# Patient Record
Sex: Male | Born: 1945 | Race: White | Hispanic: No
Health system: Southern US, Community
[De-identification: ages and names within clinical notes are randomized; demographics above are authoritative.]

## PROBLEM LIST (undated history)

## (undated) DIAGNOSIS — I5023 Acute on chronic systolic (congestive) heart failure: Secondary | ICD-10-CM

## (undated) DIAGNOSIS — J690 Pneumonitis due to inhalation of food and vomit: Secondary | ICD-10-CM

## (undated) DIAGNOSIS — J398 Other specified diseases of upper respiratory tract: Secondary | ICD-10-CM

## (undated) DIAGNOSIS — I482 Chronic atrial fibrillation, unspecified: Secondary | ICD-10-CM

## (undated) DIAGNOSIS — J9621 Acute and chronic respiratory failure with hypoxia: Secondary | ICD-10-CM

---

## 2019-04-12 ENCOUNTER — Inpatient Hospital Stay
Admission: EM | Admit: 2019-04-12 | Discharge: 2019-05-08 | Disposition: A | Discharge status: Skilled Nursing Facility | Payer: Medicare Other | Source: Other Acute Inpatient Hospital | Attending: Internal Medicine | Admitting: Internal Medicine

## 2019-04-12 ENCOUNTER — Other Ambulatory Visit (HOSPITAL_COMMUNITY): Payer: Medicare Other

## 2019-04-12 ENCOUNTER — Encounter: Payer: Self-pay | Admitting: Internal Medicine

## 2019-04-12 DIAGNOSIS — J398 Other specified diseases of upper respiratory tract: Secondary | ICD-10-CM | POA: Diagnosis present

## 2019-04-12 DIAGNOSIS — J9621 Acute and chronic respiratory failure with hypoxia: Secondary | ICD-10-CM | POA: Diagnosis present

## 2019-04-12 DIAGNOSIS — J690 Pneumonitis due to inhalation of food and vomit: Secondary | ICD-10-CM | POA: Diagnosis not present

## 2019-04-12 DIAGNOSIS — Z931 Gastrostomy status: Secondary | ICD-10-CM

## 2019-04-12 DIAGNOSIS — T17408A Unspecified foreign body in trachea causing other injury, initial encounter: Secondary | ICD-10-CM

## 2019-04-12 DIAGNOSIS — K5649 Other impaction of intestine: Secondary | ICD-10-CM

## 2019-04-12 DIAGNOSIS — J9690 Respiratory failure, unspecified, unspecified whether with hypoxia or hypercapnia: Secondary | ICD-10-CM

## 2019-04-12 DIAGNOSIS — I5023 Acute on chronic systolic (congestive) heart failure: Secondary | ICD-10-CM | POA: Diagnosis not present

## 2019-04-12 DIAGNOSIS — I482 Chronic atrial fibrillation, unspecified: Secondary | ICD-10-CM | POA: Diagnosis present

## 2019-04-12 DIAGNOSIS — R0602 Shortness of breath: Secondary | ICD-10-CM

## 2019-04-12 HISTORY — DX: Acute and chronic respiratory failure with hypoxia: J96.21

## 2019-04-12 HISTORY — DX: Chronic atrial fibrillation, unspecified: I48.20

## 2019-04-12 HISTORY — DX: Pneumonitis due to inhalation of food and vomit: J69.0

## 2019-04-12 HISTORY — DX: Other specified diseases of upper respiratory tract: J39.8

## 2019-04-12 HISTORY — DX: Acute on chronic systolic (congestive) heart failure: I50.23

## 2019-04-12 MED ORDER — ACETAMINOPHEN 325 MG PO TABS
650.00 | ORAL_TABLET | ORAL | Status: DC
Start: 2019-04-12 — End: 2019-04-12

## 2019-04-12 MED ORDER — FINASTERIDE 5 MG PO TABS
5.00 | ORAL_TABLET | ORAL | Status: DC
Start: 2019-04-13 — End: 2019-04-12

## 2019-04-12 MED ORDER — SENNOSIDES-DOCUSATE SODIUM 8.6-50 MG PO TABS
1.00 | ORAL_TABLET | ORAL | Status: DC
Start: ? — End: 2019-04-12

## 2019-04-12 MED ORDER — GENERIC EXTERNAL MEDICATION
12.50 | Status: DC
Start: 2019-04-13 — End: 2019-04-12

## 2019-04-12 MED ORDER — ALPRAZOLAM 0.5 MG PO TABS
0.50 | ORAL_TABLET | ORAL | Status: DC
Start: ? — End: 2019-04-12

## 2019-04-12 MED ORDER — APIXABAN 5 MG PO TABS
5.00 | ORAL_TABLET | ORAL | Status: DC
Start: 2019-04-12 — End: 2019-04-12

## 2019-04-12 MED ORDER — HYDROMORPHONE HCL 1 MG/ML PO LIQD
2.00 | ORAL | Status: DC
Start: ? — End: 2019-04-12

## 2019-04-12 MED ORDER — SERTRALINE HCL 50 MG PO TABS
50.00 | ORAL_TABLET | ORAL | Status: DC
Start: 2019-04-13 — End: 2019-04-12

## 2019-04-12 MED ORDER — LIDOCAINE 4 % EX PTCH
1.00 | MEDICATED_PATCH | CUTANEOUS | Status: DC
Start: 2019-04-12 — End: 2019-04-12

## 2019-04-12 MED ORDER — DIGOXIN 125 MCG PO TABS
0.13 | ORAL_TABLET | ORAL | Status: DC
Start: 2019-04-13 — End: 2019-04-12

## 2019-04-12 MED ORDER — GLYCOPYRROLATE 1 MG PO TABS
1.00 | ORAL_TABLET | ORAL | Status: DC
Start: ? — End: 2019-04-12

## 2019-04-12 MED ORDER — DIPHENOXYLATE-ATROPINE 2.5-0.025 MG/5ML PO LIQD
5.00 | ORAL | Status: DC
Start: ? — End: 2019-04-12

## 2019-04-12 MED ORDER — IOHEXOL 300 MG/ML  SOLN: 50.0000 mL | Freq: Once | INTRAMUSCULAR | Status: DC | PRN

## 2019-04-12 MED ORDER — ARFORMOTEROL TARTRATE 15 MCG/2ML IN NEBU
15.00 | INHALATION_SOLUTION | RESPIRATORY_TRACT | Status: DC
Start: 2019-04-12 — End: 2019-04-12

## 2019-04-12 MED ORDER — TERAZOSIN HCL 1 MG PO CAPS
2.00 | ORAL_CAPSULE | ORAL | Status: DC
Start: 2019-04-12 — End: 2019-04-12

## 2019-04-12 MED ORDER — DEXTROSE 10 % IV SOLN
125.00 | INTRAVENOUS | Status: DC
Start: ? — End: 2019-04-12

## 2019-04-12 MED ORDER — ROSUVASTATIN CALCIUM 20 MG PO TABS
20.00 | ORAL_TABLET | ORAL | Status: DC
Start: 2019-04-13 — End: 2019-04-12

## 2019-04-12 MED ORDER — QUINTABS PO TABS
1.00 | ORAL_TABLET | ORAL | Status: DC
Start: 2019-04-13 — End: 2019-04-12

## 2019-04-12 MED ORDER — LIDOCAINE 4 % EX PTCH
1.00 | MEDICATED_PATCH | CUTANEOUS | Status: DC
Start: 2019-04-13 — End: 2019-04-12

## 2019-04-12 MED ORDER — ASPIRIN 81 MG PO CHEW
81.00 | CHEWABLE_TABLET | ORAL | Status: DC
Start: 2019-04-13 — End: 2019-04-12

## 2019-04-12 MED ORDER — GLUCOSE 40 % PO GEL
15.00 | ORAL | Status: DC
Start: ? — End: 2019-04-12

## 2019-04-12 MED ORDER — GENERIC EXTERNAL MEDICATION
40.00 | Status: DC
Start: 2019-04-13 — End: 2019-04-12

## 2019-04-12 MED ORDER — PHENOL 1.4 % MT LIQD
1.00 | OROMUCOSAL | Status: DC
Start: ? — End: 2019-04-12

## 2019-04-12 MED ORDER — LISINOPRIL 5 MG PO TABS
5.00 | ORAL_TABLET | ORAL | Status: DC
Start: 2019-04-13 — End: 2019-04-12

## 2019-04-12 MED ORDER — BUDESONIDE 0.5 MG/2ML IN SUSP
0.50 | RESPIRATORY_TRACT | Status: DC
Start: 2019-04-12 — End: 2019-04-12

## 2019-04-12 MED ORDER — TORSEMIDE 10 MG PO TABS
20.00 | ORAL_TABLET | ORAL | Status: DC
Start: 2019-04-13 — End: 2019-04-12

## 2019-04-12 MED ORDER — MIRTAZAPINE 15 MG PO TABS
15.00 | ORAL_TABLET | ORAL | Status: DC
Start: 2019-04-12 — End: 2019-04-12

## 2019-04-12 MED ORDER — ALBUTEROL SULFATE (2.5 MG/3ML) 0.083% IN NEBU
2.50 | INHALATION_SOLUTION | RESPIRATORY_TRACT | Status: DC
Start: ? — End: 2019-04-12

## 2019-04-12 MED ORDER — POLYETHYLENE GLYCOL 3350 17 G PO PACK
17.00 | PACK | ORAL | Status: DC
Start: ? — End: 2019-04-12

## 2019-04-12 MED ORDER — GENERIC EXTERNAL MEDICATION
Status: DC
Start: ? — End: 2019-04-12

## 2019-04-12 MED ORDER — CETIRIZINE HCL 10 MG PO TABS
10.00 | ORAL_TABLET | ORAL | Status: DC
Start: 2019-04-13 — End: 2019-04-12

## 2019-04-12 MED ORDER — GABAPENTIN 300 MG PO CAPS
300.00 | ORAL_CAPSULE | ORAL | Status: DC
Start: 2019-04-12 — End: 2019-04-12

## 2019-04-12 NOTE — Consult Note (Signed)
Pulmonary [place]  Date of Service: 04/12/2019  PULMONARY CRITICAL CARE CONSULT   [name] [facility]  XNA:[id number]  DOB: 1946/03/19   DOA: 04/12/2019  Referring Physician: Merton Border, MD  HPI: Phillip Whitaker is a 73 y.o. male seen for follow up of Acute on Chronic Respiratory Failure.  Patient has multiple medical problems including congestive heart failure cardiomyopathy dysphagia coronary artery disease chronic kidney disease stage III heart block systolic function of 54% severe mitral regurgitation presented to the hospital with increasing shortness of breath.  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  This hospital admission was mainly because of the tracheostomy and exacerbation of the congestive heart failure.  Patient apparently underwent a laryngoscopy by ENT was noted to have vocal cord hypomobility and there was also some posterior glottic scarring for which reason the patient underwent a tracheostomy.  Is probably going to need to remain in place.  The tracheostomy will need to be followed up by ENT.  Patient also as noted above has a history of an ejection fraction 25% had acute congestive heart failure now is doing better apparently he will need ongoing management.  Patient also did develop aspiration and suffered sepsis for which he was treated.  Time that he is seen he is on 35% oxygen on T collar  Review of Systems:  ROS performed and is unremarkable other than noted above.  Past Medical History: Past Medical History:  Diagnosis Date  . Acute on chronic systolic congestive heart failure ([place])  . Arthritis  . Atrial fibrillation ([place])  . Chronic kidney disease  . 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 Coronary artery disease  . Epistaxis  . Gout  . Hyperlipidemia  . Hypertension  . Hypertension with goal to be determined   . Myocardial infarction ([place])  . Sinusitis  . Vertigo   Social History: Social History   Socioeconomic History  . Marital status: Divorced  Spouse name: Not on file  . Number of children: Not on file  . Years of education: Not on file  . Highest education level: Not on file  Occupational History  . Not on file  Social Needs  . Financial resource strain: Not on file  . Food insecurity  Worry: Not on file  Inability: Not on file  . Transportation needs  Medical: Not on file  Non-medical: Not on file  Tobacco Use  . Smoking status: Never Smoker  . Smokeless tobacco: Never Used   Family History: Family History  Problem Relation Age of Onset  . Diabetes Mother  . Stroke Mother  . Hypermobility Mother  . Hyperlipidemia Mother  . Diabetes Father  . Heart disease Father  . Kidney disease Father  . Hypertension Father  . No Known Problems Sister  . No Known Problems Brother    Medications: Reviewed on Rounds  Physical Exam:  Vitals: Temperature 97.8 pulse 89 respiratory 18 blood pressure 102/69 saturations 98%  Ventilator Settings off the ventilator right now on T collar with an FiO2 of 35%  . General: Comfortable at this time . Eyes: Grossly normal lids, irises & conjunctiva . ENT: grossly tongue is normal . Neck: no obvious mass . Cardiovascular: S1-S2 normal no gallop or rub . Respiratory: No rhonchi no rales are noted at this time . Abdomen: Soft and nontender . Skin: no rash seen on limited exam . Musculoskeletal: not  rigid . Psychiatric:unable to assess . Neurologic: no seizure no involuntary movements         Labs on Admission:  Basic Metabolic Panel: No results for input(s): NA, K, CL, CO2, GLUCOSE, BUN, CREATININE, CALCIUM, MG, PHOS in the last 168 hours.  No results for input(s): PHART, PCO2ART, PO2ART, HCO3, O2SAT in the last 168 hours.  Liver Function Tests: No results for input(s): AST, ALT, ALKPHOS, BILITOT, PROT, ALBUMIN in the last 168  hours. No results for input(s): LIPASE, AMYLASE in the last 168 hours. No results for input(s): AMMONIA in the last 168 hours.  CBC: No results for input(s): WBC, NEUTROABS, HGB, HCT, MCV, PLT in the last 168 hours.  Cardiac Enzymes: No results for input(s): CKTOTAL, CKMB, CKMBINDEX, TROPONINI in the last 168 hours.  BNP (last 3 results) No results for input(s): BNP in the last 8760 hours.  ProBNP (last 3 results) No results for input(s): PROBNP in the last 8760 hours.   Radiological Exams on Admission: No results found.  Assessment/Plan Active Problems:   Acute on chronic respiratory failure with hypoxia (HCC)   Acute on chronic systolic heart failure (HCC)   Chronic atrial fibrillation (HCC)   Tracheal stenosis   Aspiration pneumonia due to gastric secretions (HCC)   1. Acute on chronic respiratory failure hypoxia patient required tracheostomy for tracheal stenosis.  The tracheostomy will remain in place.  Will titrate oxygen as necessary right now is on 35% as mentioned above need to wean the FiO2 down 2. Acute on chronic systolic heart failure the patient's last ejection fraction was 25% need to monitor the fluid status closely diuretics as necessary and continue with medical management. 3. Tracheal stenosis patient had tracheostomy placed will need to follow-up with ENT 4. Aspiration pneumonia has been treated follow radiologically we will continue with supportive care 5. Chronic atrial fibrillation patient is rate now rate controlled we will continue with supportive care anticoagulation is warranted.  I have personally seen and evaluated the patient, evaluated laboratory and imaging results, formulated the assessment and plan and placed orders. The Patient requires high complexity decision making for assessment and support.  Case was discussed on Rounds with the Respiratory Therapy Staff Time Spent  Yevonne Pax, MD [facility] Pulmonary Critical Care  Medicine Sleep Medicine

## 2019-04-13 DIAGNOSIS — J690 Pneumonitis due to inhalation of food and vomit: Secondary | ICD-10-CM | POA: Diagnosis not present

## 2019-04-13 DIAGNOSIS — I482 Chronic atrial fibrillation, unspecified: Secondary | ICD-10-CM | POA: Diagnosis not present

## 2019-04-13 DIAGNOSIS — I5023 Acute on chronic systolic (congestive) heart failure: Secondary | ICD-10-CM | POA: Diagnosis not present

## 2019-04-13 DIAGNOSIS — J9621 Acute and chronic respiratory failure with hypoxia: Secondary | ICD-10-CM | POA: Diagnosis not present

## 2019-04-13 LAB — CBC
HCT: 28.8 % — ABNORMAL LOW (ref 39.0–52.0)
Hemoglobin: 9.4 g/dL — ABNORMAL LOW (ref 13.0–17.0)
MCH: 30.2 pg (ref 26.0–34.0)
MCHC: 32.6 g/dL (ref 30.0–36.0)
MCV: 92.6 fL (ref 80.0–100.0)
Platelets: 205 10*3/uL (ref 150–400)
RBC: 3.11 MIL/uL — ABNORMAL LOW (ref 4.22–5.81)
RDW: 16.7 % — ABNORMAL HIGH (ref 11.5–15.5)
WBC: 12.3 10*3/uL — ABNORMAL HIGH (ref 4.0–10.5)
nRBC: 0 % (ref 0.0–0.2)

## 2019-04-13 LAB — URINALYSIS, ROUTINE W REFLEX MICROSCOPIC
Bilirubin Urine: NEGATIVE
Glucose, UA: NEGATIVE mg/dL
Hgb urine dipstick: NEGATIVE
Ketones, ur: NEGATIVE mg/dL
Leukocytes,Ua: NEGATIVE
Nitrite: NEGATIVE
Protein, ur: NEGATIVE mg/dL
Specific Gravity, Urine: 1.008 (ref 1.005–1.030)
pH: 7 (ref 5.0–8.0)

## 2019-04-13 LAB — COMPREHENSIVE METABOLIC PANEL
ALT: 60 U/L — ABNORMAL HIGH (ref 0–44)
AST: 65 U/L — ABNORMAL HIGH (ref 15–41)
Albumin: 1.8 g/dL — ABNORMAL LOW (ref 3.5–5.0)
Alkaline Phosphatase: 74 U/L (ref 38–126)
Anion gap: 10 (ref 5–15)
BUN: 23 mg/dL (ref 8–23)
CO2: 29 mmol/L (ref 22–32)
Calcium: 8 mg/dL — ABNORMAL LOW (ref 8.9–10.3)
Chloride: 99 mmol/L (ref 98–111)
Creatinine, Ser: 1.22 mg/dL (ref 0.61–1.24)
GFR calc Af Amer: >60 mL/min (ref >60)
GFR calc non Af Amer: 58 mL/min — ABNORMAL LOW (ref >60)
Glucose, Bld: 126 mg/dL — ABNORMAL HIGH (ref 70–99)
Potassium: 3.4 mmol/L — ABNORMAL LOW (ref 3.5–5.1)
Sodium: 138 mmol/L (ref 135–145)
Total Bilirubin: 1 mg/dL (ref 0.3–1.2)
Total Protein: 4.7 g/dL — ABNORMAL LOW (ref 6.5–8.1)

## 2019-04-13 LAB — TSH: TSH: 2.701 u[IU]/mL (ref 0.350–4.500)

## 2019-04-13 NOTE — Progress Notes (Signed)
Pulmonary Critical Care Medicine [place]   PULMONARY CRITICAL CARE SERVICE  PROGRESS NOTE  Date of Service: 04/13/2019  Kycen Spalla  KZS:[id number]  DOB: 04/21/1946   DOA: 04/12/2019  Referring Physician: Merton Border, MD  HPI: Zarif Rathje is a 73 y.o. male seen for follow up of Acute on Chronic Respiratory Failure.  Patient right now is on T collar has been on 30% FiO2 with good saturations at this time  Medications: Reviewed on Rounds  Physical Exam:  Vitals: Temperature 98.3 pulse 85 respiratory rate 15 blood pressure 119/65 saturations 100%  Ventilator Settings off the ventilator right now on T collar with an FiO2 of 30%  . General: Comfortable at this time . Eyes: Grossly normal lids, irises & conjunctiva . ENT: grossly tongue is normal . Neck: no obvious mass . Cardiovascular: S1 S2 normal no gallop . Respiratory: No rhonchi coarse breath sounds are noted . Abdomen: soft . Skin: no rash seen on limited exam . Musculoskeletal: not rigid . Psychiatric:unable to assess . Neurologic: no seizure no involuntary movements         Lab Data:   Basic Metabolic Panel: No results for input(s): NA, K, CL, CO2, GLUCOSE, BUN, CREATININE, CALCIUM, MG, PHOS in the last 168 hours.  ABG: No results for input(s): PHART, PCO2ART, PO2ART, HCO3, O2SAT in the last 168 hours.  Liver Function Tests: No results for input(s): AST, ALT, ALKPHOS, BILITOT, PROT, ALBUMIN in the last 168 hours. No results for input(s): LIPASE, AMYLASE in the last 168 hours. No results for input(s): AMMONIA in the last 168 hours.  CBC: No results for input(s): WBC, NEUTROABS, HGB, HCT, MCV, PLT in the last 168 hours.  Cardiac Enzymes: No results for input(s): CKTOTAL, CKMB, CKMBINDEX, TROPONINI in the last 168 hours.  BNP (last 3 results) No results for input(s): BNP in the last 8760 hours.  ProBNP (last 3 results) No results for input(s): PROBNP in the last  8760 hours.  Radiological Exams: Dg Abdomen Peg Tube Location  Result Date: 04/12/2019 CLINICAL DATA:  Peg tube placement EXAM: ABDOMEN - 1 VIEW COMPARISON:  None. FINDINGS: Injection through the patient's gastrostomy tube opacifies the stomach. The bowel gas pattern is nonspecific and nonobstructive. The partially visualized heart appears significantly enlarged. IMPRESSION: Injection of contrast through the gastrostomy tube opacifies the stomach. Electronically Signed   By: Constance Holster M.D.   On: 04/12/2019 18:31   Dg Chest Port 1 View  Result Date: 04/12/2019 CLINICAL DATA:  Tracheostomy tube EXAM: PORTABLE CHEST 1 VIEW COMPARISON:  None. FINDINGS: There is a tracheostomy tube terminates above the carina. There is a left-sided ICD. The heart size is significantly enlarged. The patient is status post prior median sternotomy. There is no pneumothorax. There are moderate-sized bilateral pleural effusions. Bibasilar airspace opacities are noted favored to represent compressive atelectasis with an infiltrate not excluded. There is no acute osseous abnormality. Multiple old bilateral rib fractures are noted. IMPRESSION: 1. Lines and tubes as above. 2. Significant cardiomegaly. 3. Moderate-sized bilateral pleural effusions. 4. Bibasilar airspace opacities favored to represent compressive atelectasis with an infiltrate not excluded. Electronically Signed   By: Constance Holster M.D.   On: 04/12/2019 18:32    Assessment/Plan Active Problems:   Acute on chronic respiratory failure with hypoxia (HCC)   Acute on chronic systolic heart failure (HCC)   Chronic atrial fibrillation (HCC)   Tracheal stenosis   Aspiration pneumonia due to gastric secretions ([place])   1. Acute on chronic  respiratory failure hypoxia patient remains on T collar 30% FiO2 continue to advance as tolerated.  Secretions are fair to moderate.  Patient is not a candidate for decannulation 2. Acute on chronic systolic heart  failure follow radiologically the last chest x-ray shows moderate-sized effusions and some airspace disease along with significant cardiomegaly patient's ejection fraction was 25% 3. Chronic atrial fibrillation rate is controlled 4. Aspiration pneumonia has been treated we will continue to follow 5. Tracheal stenosis will need follow-up after discharge with ENT   I have personally seen and evaluated the patient, evaluated laboratory and imaging results, formulated the assessment and plan and placed orders. The Patient requires high complexity decision making for assessment and support.  Case was discussed on Rounds with the Respiratory Therapy Staff  Yevonne Pax, MD [facility] Pulmonary Critical Care Medicine Sleep Medicine

## 2019-04-14 DIAGNOSIS — J690 Pneumonitis due to inhalation of food and vomit: Secondary | ICD-10-CM | POA: Diagnosis not present

## 2019-04-14 DIAGNOSIS — I482 Chronic atrial fibrillation, unspecified: Secondary | ICD-10-CM | POA: Diagnosis not present

## 2019-04-14 DIAGNOSIS — J9621 Acute and chronic respiratory failure with hypoxia: Secondary | ICD-10-CM | POA: Diagnosis not present

## 2019-04-14 DIAGNOSIS — I5023 Acute on chronic systolic (congestive) heart failure: Secondary | ICD-10-CM | POA: Diagnosis not present

## 2019-04-14 LAB — BASIC METABOLIC PANEL
Anion gap: 10 (ref 5–15)
BUN: 24 mg/dL — ABNORMAL HIGH (ref 8–23)
CO2: 32 mmol/L (ref 22–32)
Calcium: 8.4 mg/dL — ABNORMAL LOW (ref 8.9–10.3)
Chloride: 96 mmol/L — ABNORMAL LOW (ref 98–111)
Creatinine, Ser: 1.33 mg/dL — ABNORMAL HIGH (ref 0.61–1.24)
GFR calc Af Amer: >60 mL/min (ref >60)
GFR calc non Af Amer: 52 mL/min — ABNORMAL LOW (ref >60)
Glucose, Bld: 113 mg/dL — ABNORMAL HIGH (ref 70–99)
Potassium: 4.3 mmol/L (ref 3.5–5.1)
Sodium: 138 mmol/L (ref 135–145)

## 2019-04-14 LAB — CBC
HCT: 30.9 % — ABNORMAL LOW (ref 39.0–52.0)
Hemoglobin: 9.8 g/dL — ABNORMAL LOW (ref 13.0–17.0)
MCH: 30.4 pg (ref 26.0–34.0)
MCHC: 31.7 g/dL (ref 30.0–36.0)
MCV: 96 fL (ref 80.0–100.0)
Platelets: 232 10*3/uL (ref 150–400)
RBC: 3.22 MIL/uL — ABNORMAL LOW (ref 4.22–5.81)
RDW: 16.5 % — ABNORMAL HIGH (ref 11.5–15.5)
WBC: 9.7 10*3/uL (ref 4.0–10.5)
nRBC: 0 % (ref 0.0–0.2)

## 2019-04-14 NOTE — Progress Notes (Signed)
Pulmonary Critical Care Medicine [facility]   PULMONARY CRITICAL CARE SERVICE  PROGRESS NOTE  Date of Service: 04/14/2019  Phillip Whitaker  [id number]  DOB: 10/22/1945   DOA: 04/12/2019  Referring Physician: Carron Curie, MD  HPI: Phillip Whitaker is a 73 y.o. male seen for follow up of Acute on Chronic Respiratory Failure.  Patient is on T collar is on 30% FiO2 using PMV  Medications: Reviewed on Rounds  Physical Exam:  Vitals: Temperature 96.7 pulse 90 respiratory 21 blood pressure is 134/75 saturations 100%  Ventilator Settings on T collar with an FiO2 30% and on PMV  . General: Comfortable at this time . Eyes: Grossly normal lids, irises & conjunctiva . ENT: grossly tongue is normal . Neck: no obvious mass . Cardiovascular: S1 S2 normal no gallop . Respiratory: No rhonchi no rales are noted . Abdomen: soft . Skin: no rash seen on limited exam . Musculoskeletal: not rigid . Psychiatric:unable to assess . Neurologic: no seizure no involuntary movements         Lab Data:   Basic Metabolic Panel: Recent Labs  Lab 04/13/19 1430 04/14/19 0533  NA 138 138  K 3.4* 4.3  CL 99 96*  CO2 29 32  GLUCOSE 126* 113*  BUN 23 24*  CREATININE 1.22 1.33*  CALCIUM 8.0* 8.4*    ABG: No results for input(s): PHART, PCO2ART, PO2ART, HCO3, O2SAT in the last 168 hours.  Liver Function Tests: Recent Labs  Lab 04/13/19 1430  AST 65*  ALT 60*  ALKPHOS 74  BILITOT 1.0  PROT 4.7*  ALBUMIN 1.8*   No results for input(s): LIPASE, AMYLASE in the last 168 hours. No results for input(s): AMMONIA in the last 168 hours.  CBC: Recent Labs  Lab 04/13/19 1430 04/14/19 0533  WBC 12.3* 9.7  HGB 9.4* 9.8*  HCT 28.8* 30.9*  MCV 92.6 96.0  PLT 205 232    Cardiac Enzymes: No results for input(s): CKTOTAL, CKMB, CKMBINDEX, TROPONINI in the last 168 hours.  BNP (last 3 results) No results for input(s): BNP in the last 8760 hours.  ProBNP  (last 3 results) No results for input(s): PROBNP in the last 8760 hours.  Radiological Exams: Dg Abdomen Peg Tube Location  Result Date: 04/12/2019 CLINICAL DATA:  Peg tube placement EXAM: ABDOMEN - 1 VIEW COMPARISON:  None. FINDINGS: Injection through the patient's gastrostomy tube opacifies the stomach. The bowel gas pattern is nonspecific and nonobstructive. The partially visualized heart appears significantly enlarged. IMPRESSION: Injection of contrast through the gastrostomy tube opacifies the stomach. Electronically Signed   By: Katherine Mantle M.D.   On: 04/12/2019 18:31   Dg Chest Port 1 View  Result Date: 04/12/2019 CLINICAL DATA:  Tracheostomy tube EXAM: PORTABLE CHEST 1 VIEW COMPARISON:  None. FINDINGS: There is a tracheostomy tube terminates above the carina. There is a left-sided ICD. The heart size is significantly enlarged. The patient is status post prior median sternotomy. There is no pneumothorax. There are moderate-sized bilateral pleural effusions. Bibasilar airspace opacities are noted favored to represent compressive atelectasis with an infiltrate not excluded. There is no acute osseous abnormality. Multiple old bilateral rib fractures are noted. IMPRESSION: 1. Lines and tubes as above. 2. Significant cardiomegaly. 3. Moderate-sized bilateral pleural effusions. 4. Bibasilar airspace opacities favored to represent compressive atelectasis with an infiltrate not excluded. Electronically Signed   By: Katherine Mantle M.D.   On: 04/12/2019 18:32    Assessment/Plan Active Problems:   Acute on chronic respiratory  failure with hypoxia (HCC)   Acute on chronic systolic heart failure (HCC)   Chronic atrial fibrillation (HCC)   Tracheal stenosis   Aspiration pneumonia due to gastric secretions ([place])   1. Acute on chronic respiratory failure hypoxia plan is to continue T collar on titrate oxygen as tolerated 2. Acute on chronic systolic heart failure continue with monitoring  fluid status 3. Chronic atrial fibrillation rate controlled 4. Tracheal stenosis stable 5. Aspiration pneumonia treated   I have personally seen and evaluated the patient, evaluated laboratory and imaging results, formulated the assessment and plan and placed orders. The Patient requires high complexity decision making for assessment and support.  Case was discussed on Rounds with the Respiratory Therapy Staff  Allyne Gee, MD [facility] Pulmonary Critical Care Medicine Sleep Medicine

## 2019-04-15 DIAGNOSIS — J9621 Acute and chronic respiratory failure with hypoxia: Secondary | ICD-10-CM | POA: Diagnosis not present

## 2019-04-15 DIAGNOSIS — J690 Pneumonitis due to inhalation of food and vomit: Secondary | ICD-10-CM | POA: Diagnosis not present

## 2019-04-15 DIAGNOSIS — I5023 Acute on chronic systolic (congestive) heart failure: Secondary | ICD-10-CM | POA: Diagnosis not present

## 2019-04-15 DIAGNOSIS — I482 Chronic atrial fibrillation, unspecified: Secondary | ICD-10-CM | POA: Diagnosis not present

## 2019-04-15 NOTE — Progress Notes (Signed)
Pulmonary Critical Care Medicine [place]   PULMONARY CRITICAL CARE SERVICE  PROGRESS NOTE  Date of Service: 04/15/2019  Phillip Whitaker  ALP:[id number]  DOB: 04-14-1946   DOA: 04/12/2019  Referring Physician: Merton Border, MD  HPI: Phillip Whitaker is a 73 y.o. male seen for follow up of Acute on Chronic Respiratory Failure.  Patient currently is on T collar has been on 20% FiO2 with good saturations  Medications: Reviewed on Rounds  Physical Exam:  Vitals: Temperature 97.5 pulse 80 respiratory rate 16 blood pressure 126/63 saturations 100%  Ventilator Settings patient is on T collar currently on 28% FiO2 with the PMV  . General: Comfortable at this time . Eyes: Grossly normal lids, irises & conjunctiva . ENT: grossly tongue is normal . Neck: no obvious mass . Cardiovascular: S1 S2 normal no gallop . Respiratory: No rhonchi no rales are noted at this time . Abdomen: soft . Skin: no rash seen on limited exam . Musculoskeletal: not rigid . Psychiatric:unable to assess . Neurologic: no seizure no involuntary movements         Lab Data:   Basic Metabolic Panel: Recent Labs  Lab 04/13/19 1430 04/14/19 0533  NA 138 138  K 3.4* 4.3  CL 99 96*  CO2 29 32  GLUCOSE 126* 113*  BUN 23 24*  CREATININE 1.22 1.33*  CALCIUM 8.0* 8.4*    ABG: No results for input(s): PHART, PCO2ART, PO2ART, HCO3, O2SAT in the last 168 hours.  Liver Function Tests: Recent Labs  Lab 04/13/19 1430  AST 65*  ALT 60*  ALKPHOS 74  BILITOT 1.0  PROT 4.7*  ALBUMIN 1.8*   No results for input(s): LIPASE, AMYLASE in the last 168 hours. No results for input(s): AMMONIA in the last 168 hours.  CBC: Recent Labs  Lab 04/13/19 1430 04/14/19 0533  WBC 12.3* 9.7  HGB 9.4* 9.8*  HCT 28.8* 30.9*  MCV 92.6 96.0  PLT 205 232    Cardiac Enzymes: No results for input(s): CKTOTAL, CKMB, CKMBINDEX, TROPONINI in the last 168 hours.  BNP (last 3 results) No  results for input(s): BNP in the last 8760 hours.  ProBNP (last 3 results) No results for input(s): PROBNP in the last 8760 hours.  Radiological Exams: No results found.  Assessment/Plan Active Problems:   Acute on chronic respiratory failure with hypoxia (HCC)   Acute on chronic systolic heart failure (HCC)   Chronic atrial fibrillation (HCC)   Tracheal stenosis   Aspiration pneumonia due to gastric secretions ([place])   1. Acute on chronic respiratory failure with hypoxia patient is comfortable right now without any distress use the PMV as tolerated 2. Acute on chronic systolic heart failure compensated at this time 3. Chronic atrial fibrillation rate controlled 4. Tracheal stenosis no changes are noted 5. Aspiration pneumonia treated we will continue with supportive care   I have personally seen and evaluated the patient, evaluated laboratory and imaging results, formulated the assessment and plan and placed orders. The Patient requires high complexity decision making for assessment and support.  Case was discussed on Rounds with the Respiratory Therapy Staff  Allyne Gee, MD [facility] Pulmonary Critical Care Medicine Sleep Medicine

## 2019-04-16 DIAGNOSIS — I5023 Acute on chronic systolic (congestive) heart failure: Secondary | ICD-10-CM | POA: Diagnosis not present

## 2019-04-16 DIAGNOSIS — J690 Pneumonitis due to inhalation of food and vomit: Secondary | ICD-10-CM | POA: Diagnosis not present

## 2019-04-16 DIAGNOSIS — J9621 Acute and chronic respiratory failure with hypoxia: Secondary | ICD-10-CM | POA: Diagnosis not present

## 2019-04-16 DIAGNOSIS — I482 Chronic atrial fibrillation, unspecified: Secondary | ICD-10-CM | POA: Diagnosis not present

## 2019-04-16 NOTE — Progress Notes (Signed)
Pulmonary Critical Care Medicine [place]   PULMONARY CRITICAL CARE SERVICE  PROGRESS NOTE  Date of Service: 04/16/2019  Phillip Whitaker  [id number]  DOB: [birth year]   DOA: 04/12/2019  Referring Physician: Merton Border, MD  HPI: Phillip Whitaker is a 73 y.o. male seen for follow up of Acute on Chronic Respiratory Failure.  Patient is right now on T collar has been on 28% FiO2 with good saturations at this time  Medications: Reviewed on Rounds  Physical Exam:  Vitals: Temperature is 97.4 pulse 81 respiratory rate 20 blood pressure 106/62 saturations 98%  Ventilator Settings off the ventilator on T collar right now  . General: Comfortable at this time . Eyes: Grossly normal lids, irises & conjunctiva . ENT: grossly tongue is normal . Neck: no obvious mass . Cardiovascular: S1 S2 normal no gallop . Respiratory: No rhonchi no rales are noted at this time . Abdomen: soft . Skin: no rash seen on limited exam . Musculoskeletal: not rigid . Psychiatric:unable to assess . Neurologic: no seizure no involuntary movements         Lab Data:   Basic Metabolic Panel: Recent Labs  Lab 04/13/19 1430 04/14/19 0533  NA 138 138  K 3.4* 4.3  CL 99 96*  CO2 29 32  GLUCOSE 126* 113*  BUN 23 24*  CREATININE 1.22 1.33*  CALCIUM 8.0* 8.4*    ABG: No results for input(s): PHART, PCO2ART, PO2ART, HCO3, O2SAT in the last 168 hours.  Liver Function Tests: Recent Labs  Lab 04/13/19 1430  AST 65*  ALT 60*  ALKPHOS 74  BILITOT 1.0  PROT 4.7*  ALBUMIN 1.8*   No results for input(s): LIPASE, AMYLASE in the last 168 hours. No results for input(s): AMMONIA in the last 168 hours.  CBC: Recent Labs  Lab 04/13/19 1430 04/14/19 0533  WBC 12.3* 9.7  HGB 9.4* 9.8*  HCT 28.8* 30.9*  MCV 92.6 96.0  PLT 205 232    Cardiac Enzymes: No results for input(s): CKTOTAL, CKMB, CKMBINDEX, TROPONINI in the last 168 hours.  BNP (last 3 results) No  results for input(s): BNP in the last 8760 hours.  ProBNP (last 3 results) No results for input(s): PROBNP in the last 8760 hours.  Radiological Exams: No results found.  Assessment/Plan Active Problems:   Acute on chronic respiratory failure with hypoxia (HCC)   Acute on chronic systolic heart failure (HCC)   Chronic atrial fibrillation (HCC)   Tracheal stenosis   Aspiration pneumonia due to gastric secretions ([place])   1. Acute on chronic respiratory failure hypoxia continue with T collar as tolerated patient is doing well on 28% FiO2 2. Acute systolic heart failure compensated continue present management 3. Chronic atrial fibrillation rate is controlled 4. Tracheal stenosis follow-up after discharge 5. Aspiration pneumonia treated improved   I have personally seen and evaluated the patient, evaluated laboratory and imaging results, formulated the assessment and plan and placed orders. The Patient requires high complexity decision making for assessment and support.  Case was discussed on Rounds with the Respiratory Therapy Staff  Allyne Gee, MD [facility] Pulmonary Critical Care Medicine Sleep Medicine

## 2019-04-17 DIAGNOSIS — J9621 Acute and chronic respiratory failure with hypoxia: Secondary | ICD-10-CM | POA: Diagnosis not present

## 2019-04-17 DIAGNOSIS — J690 Pneumonitis due to inhalation of food and vomit: Secondary | ICD-10-CM | POA: Diagnosis not present

## 2019-04-17 DIAGNOSIS — I5023 Acute on chronic systolic (congestive) heart failure: Secondary | ICD-10-CM | POA: Diagnosis not present

## 2019-04-17 DIAGNOSIS — I482 Chronic atrial fibrillation, unspecified: Secondary | ICD-10-CM | POA: Diagnosis not present

## 2019-04-17 NOTE — Progress Notes (Signed)
Pulmonary Critical Care Medicine [place]   PULMONARY CRITICAL CARE SERVICE  PROGRESS NOTE  Date of Service: 04/17/2019  Phillip Whitaker  [id number]  DOB: 06-29-1945   DOA: 04/12/2019  Referring Physician: Merton Border, MD  HPI: Phillip Whitaker is a 73 y.o. male seen for follow up of Acute on Chronic Respiratory Failure.  Currently is on T collar patient's been tolerating PMV is on 28% FiO2 good saturations  Medications: Reviewed on Rounds  Physical Exam:  Vitals: Temperature 98.6 pulse 80 respiratory rate 20 blood pressure 94/57 saturations 98%  Ventilator Settings on T collar with an FiO2 28% using the PMV also  . General: Comfortable at this time . Eyes: Grossly normal lids, irises & conjunctiva . ENT: grossly tongue is normal . Neck: no obvious mass . Cardiovascular: S1 S2 normal no gallop . Respiratory: No rhonchi no rales are noted at this time . Abdomen: soft . Skin: no rash seen on limited exam . Musculoskeletal: not rigid . Psychiatric:unable to assess . Neurologic: no seizure no involuntary movements         Lab Data:   Basic Metabolic Panel: Recent Labs  Lab 04/13/19 1430 04/14/19 0533  NA 138 138  K 3.4* 4.3  CL 99 96*  CO2 29 32  GLUCOSE 126* 113*  BUN 23 24*  CREATININE 1.22 1.33*  CALCIUM 8.0* 8.4*    ABG: No results for input(s): PHART, PCO2ART, PO2ART, HCO3, O2SAT in the last 168 hours.  Liver Function Tests: Recent Labs  Lab 04/13/19 1430  AST 65*  ALT 60*  ALKPHOS 74  BILITOT 1.0  PROT 4.7*  ALBUMIN 1.8*   No results for input(s): LIPASE, AMYLASE in the last 168 hours. No results for input(s): AMMONIA in the last 168 hours.  CBC: Recent Labs  Lab 04/13/19 1430 04/14/19 0533  WBC 12.3* 9.7  HGB 9.4* 9.8*  HCT 28.8* 30.9*  MCV 92.6 96.0  PLT 205 232    Cardiac Enzymes: No results for input(s): CKTOTAL, CKMB, CKMBINDEX, TROPONINI in the last 168 hours.  BNP (last 3 results) No  results for input(s): BNP in the last 8760 hours.  ProBNP (last 3 results) No results for input(s): PROBNP in the last 8760 hours.  Radiological Exams: No results found.  Assessment/Plan Active Problems:   Acute on chronic respiratory failure with hypoxia (HCC)   Acute on chronic systolic heart failure (HCC)   Chronic atrial fibrillation (HCC)   Tracheal stenosis   Aspiration pneumonia due to gastric secretions ([place])   1. Acute on chronic respiratory failure hypoxia plan is to continue with T collar trials titrate oxygen continue pulmonary toilet 2. Acute on chronic systolic heart failure compensated we will continue with present management 3. Chronic atrial fibrillation rate is controlled 4. Tracheal stenosis follow-up after discharge 5. Aspiration pneumonia treated we will continue with supportive care   I have personally seen and evaluated the patient, evaluated laboratory and imaging results, formulated the assessment and plan and placed orders. The Patient requires high complexity decision making for assessment and support.  Case was discussed on Rounds with the Respiratory Therapy Staff  Allyne Gee, MD [facility] Pulmonary Critical Care Medicine Sleep Medicine

## 2019-04-18 DIAGNOSIS — J9621 Acute and chronic respiratory failure with hypoxia: Secondary | ICD-10-CM | POA: Diagnosis not present

## 2019-04-18 DIAGNOSIS — I5023 Acute on chronic systolic (congestive) heart failure: Secondary | ICD-10-CM | POA: Diagnosis not present

## 2019-04-18 DIAGNOSIS — I482 Chronic atrial fibrillation, unspecified: Secondary | ICD-10-CM | POA: Diagnosis not present

## 2019-04-18 DIAGNOSIS — J690 Pneumonitis due to inhalation of food and vomit: Secondary | ICD-10-CM | POA: Diagnosis not present

## 2019-04-18 NOTE — Progress Notes (Signed)
Pulmonary Critical Care Medicine [place]   PULMONARY CRITICAL CARE SERVICE  PROGRESS NOTE  Date of Service: 04/18/2019  Phillip Whitaker  JWJ:[id number]  DOB: 07-Jun-1945   DOA: 04/12/2019  Referring Physician: Merton Border, MD  HPI: Phillip Whitaker is a 73 y.o. male seen for follow up of Acute on Chronic Respiratory Failure.  Patient is on T collar has been on 28% FiO2 at baseline.  Right now is comfortable without distress  Medications: Reviewed on Rounds  Physical Exam:  Vitals: Temperature 97.2 pulse 87 respiratory rate 21 blood pressure 92/55 saturations 98%  Ventilator Settings currently is off the ventilator on T collar  . General: Comfortable at this time . Eyes: Grossly normal lids, irises & conjunctiva . ENT: grossly tongue is normal . Neck: no obvious mass . Cardiovascular: S1 S2 normal no gallop . Respiratory: No rhonchi no rales are noted at this time . Abdomen: soft . Skin: no rash seen on limited exam . Musculoskeletal: not rigid . Psychiatric:unable to assess . Neurologic: no seizure no involuntary movements         Lab Data:   Basic Metabolic Panel: Recent Labs  Lab 04/13/19 1430 04/14/19 0533  NA 138 138  K 3.4* 4.3  CL 99 96*  CO2 29 32  GLUCOSE 126* 113*  BUN 23 24*  CREATININE 1.22 1.33*  CALCIUM 8.0* 8.4*    ABG: No results for input(s): PHART, PCO2ART, PO2ART, HCO3, O2SAT in the last 168 hours.  Liver Function Tests: Recent Labs  Lab 04/13/19 1430  AST 65*  ALT 60*  ALKPHOS 74  BILITOT 1.0  PROT 4.7*  ALBUMIN 1.8*   No results for input(s): LIPASE, AMYLASE in the last 168 hours. No results for input(s): AMMONIA in the last 168 hours.  CBC: Recent Labs  Lab 04/13/19 1430 04/14/19 0533  WBC 12.3* 9.7  HGB 9.4* 9.8*  HCT 28.8* 30.9*  MCV 92.6 96.0  PLT 205 232    Cardiac Enzymes: No results for input(s): CKTOTAL, CKMB, CKMBINDEX, TROPONINI in the last 168 hours.  BNP (last 3  results) No results for input(s): BNP in the last 8760 hours.  ProBNP (last 3 results) No results for input(s): PROBNP in the last 8760 hours.  Radiological Exams: No results found.  Assessment/Plan Active Problems:   Acute on chronic respiratory failure with hypoxia (HCC)   Acute on chronic systolic heart failure (HCC)   Chronic atrial fibrillation (HCC)   Tracheal stenosis   Aspiration pneumonia due to gastric secretions ([place])   1. Acute on chronic respiratory failure with hypoxia patient is on T collar and on 28% FiO2 2. Acute on chronic systolic heart failure compensated 3. Chronic atrial fibrillation rate is controlled 4. Aspiration pneumonia treated 5. Tracheal stenosis at baseline   I have personally seen and evaluated the patient, evaluated laboratory and imaging results, formulated the assessment and plan and placed orders. The Patient requires high complexity decision making for assessment and support.  Case was discussed on Rounds with the Respiratory Therapy Staff  Allyne Gee, MD [facility] Pulmonary Critical Care Medicine Sleep Medicine

## 2019-04-19 DIAGNOSIS — J9621 Acute and chronic respiratory failure with hypoxia: Secondary | ICD-10-CM | POA: Diagnosis not present

## 2019-04-19 DIAGNOSIS — J690 Pneumonitis due to inhalation of food and vomit: Secondary | ICD-10-CM | POA: Diagnosis not present

## 2019-04-19 DIAGNOSIS — I5023 Acute on chronic systolic (congestive) heart failure: Secondary | ICD-10-CM | POA: Diagnosis not present

## 2019-04-19 DIAGNOSIS — I482 Chronic atrial fibrillation, unspecified: Secondary | ICD-10-CM | POA: Diagnosis not present

## 2019-04-19 LAB — BASIC METABOLIC PANEL WITH GFR
Anion gap: 9 (ref 5–15)
BUN: 27 mg/dL — ABNORMAL HIGH (ref 8–23)
CO2: 29 mmol/L (ref 22–32)
Calcium: 8.3 mg/dL — ABNORMAL LOW (ref 8.9–10.3)
Chloride: 97 mmol/L — ABNORMAL LOW (ref 98–111)
Creatinine, Ser: 1.12 mg/dL (ref 0.61–1.24)
GFR calc Af Amer: >60 mL/min
GFR calc non Af Amer: >60 mL/min
Glucose, Bld: 95 mg/dL (ref 70–99)
Potassium: 4.8 mmol/L (ref 3.5–5.1)
Sodium: 135 mmol/L (ref 135–145)

## 2019-04-19 LAB — CBC
HCT: 29.6 % — ABNORMAL LOW (ref 39.0–52.0)
Hemoglobin: 9.3 g/dL — ABNORMAL LOW (ref 13.0–17.0)
MCH: 29.9 pg (ref 26.0–34.0)
MCHC: 31.4 g/dL (ref 30.0–36.0)
MCV: 95.2 fL (ref 80.0–100.0)
Platelets: 197 10*3/uL (ref 150–400)
RBC: 3.11 MIL/uL — ABNORMAL LOW (ref 4.22–5.81)
RDW: 16.7 % — ABNORMAL HIGH (ref 11.5–15.5)
WBC: 5.3 10*3/uL (ref 4.0–10.5)
nRBC: 0 % (ref 0.0–0.2)

## 2019-04-19 NOTE — Progress Notes (Signed)
Pulmonary Critical Care Medicine [place]   PULMONARY CRITICAL CARE SERVICE  PROGRESS NOTE  Date of Service: 04/19/2019  Al Bracewell  WIO:[id number]  DOB: Jul 23, 1945   DOA: 04/12/2019  Referring Physician: Merton Border, MD  HPI: Phillip Whitaker is a 73 y.o. male seen for follow up of Acute on Chronic Respiratory Failure.  Patient is on T collar has been on 28% FiO2 with good saturations  Medications: Reviewed on Rounds  Physical Exam:  Vitals: Temperature 96.9 pulse 80 respiratory rate 12 blood pressure 110/72 saturations 96%  Ventilator Settings off the ventilator on T collar currently on 20% FiO2 with the PMV  . General: Comfortable at this time . Eyes: Grossly normal lids, irises & conjunctiva . ENT: grossly tongue is normal . Neck: no obvious mass . Cardiovascular: S1 S2 normal no gallop . Respiratory: No rhonchi no rales are noted at this time . Abdomen: soft . Skin: no rash seen on limited exam . Musculoskeletal: not rigid . Psychiatric:unable to assess . Neurologic: no seizure no involuntary movements         Lab Data:   Basic Metabolic Panel: Recent Labs  Lab 04/13/19 1430 04/14/19 0533 04/19/19 0538  NA 138 138 135  K 3.4* 4.3 4.8  CL 99 96* 97*  CO2 29 32 29  GLUCOSE 126* 113* 95  BUN 23 24* 27*  CREATININE 1.22 1.33* 1.12  CALCIUM 8.0* 8.4* 8.3*    ABG: No results for input(s): PHART, PCO2ART, PO2ART, HCO3, O2SAT in the last 168 hours.  Liver Function Tests: Recent Labs  Lab 04/13/19 1430  AST 65*  ALT 60*  ALKPHOS 74  BILITOT 1.0  PROT 4.7*  ALBUMIN 1.8*   No results for input(s): LIPASE, AMYLASE in the last 168 hours. No results for input(s): AMMONIA in the last 168 hours.  CBC: Recent Labs  Lab 04/13/19 1430 04/14/19 0533 04/19/19 0538  WBC 12.3* 9.7 5.3  HGB 9.4* 9.8* 9.3*  HCT 28.8* 30.9* 29.6*  MCV 92.6 96.0 95.2  PLT 205 232 197    Cardiac Enzymes: No results for input(s):  CKTOTAL, CKMB, CKMBINDEX, TROPONINI in the last 168 hours.  BNP (last 3 results) No results for input(s): BNP in the last 8760 hours.  ProBNP (last 3 results) No results for input(s): PROBNP in the last 8760 hours.  Radiological Exams: No results found.  Assessment/Plan Active Problems:   Acute on chronic respiratory failure with hypoxia (HCC)   Acute on chronic systolic heart failure (HCC)   Chronic atrial fibrillation (HCC)   Tracheal stenosis   Aspiration pneumonia due to gastric secretions ([place])   1. Acute on chronic respiratory failure hypoxia plan is to continue with the T collar patient is at baseline using PMV tolerating it well 2. Acute on chronic systolic heart failure compensated 3. Chronic atrial fibrillation rate controlled 4. Aspiration pneumonia treated 5. Tracheal stenosis follow-up after discharge   I have personally seen and evaluated the patient, evaluated laboratory and imaging results, formulated the assessment and plan and placed orders. The Patient requires high complexity decision making for assessment and support.  Case was discussed on Rounds with the Respiratory Therapy Staff  Allyne Gee, MD [facility] Pulmonary Critical Care Medicine Sleep Medicine

## 2019-04-20 DIAGNOSIS — J9621 Acute and chronic respiratory failure with hypoxia: Secondary | ICD-10-CM | POA: Diagnosis not present

## 2019-04-20 DIAGNOSIS — I5023 Acute on chronic systolic (congestive) heart failure: Secondary | ICD-10-CM | POA: Diagnosis not present

## 2019-04-20 DIAGNOSIS — J690 Pneumonitis due to inhalation of food and vomit: Secondary | ICD-10-CM | POA: Diagnosis not present

## 2019-04-20 DIAGNOSIS — I482 Chronic atrial fibrillation, unspecified: Secondary | ICD-10-CM | POA: Diagnosis not present

## 2019-04-20 NOTE — Progress Notes (Signed)
Pulmonary Critical Care Medicine [place]   PULMONARY CRITICAL CARE SERVICE  PROGRESS NOTE  Date of Service: 04/20/2019  Phillip Whitaker  [id number]  DOB: 08-26-1945   DOA: 04/12/2019  Referring Physician: Merton Border, MD  HPI: Phillip Whitaker is a 73 y.o. male seen for follow up of Acute on Chronic Respiratory Failure.  Patient is on T collar has been on 28% FiO2 with good saturations  Medications: Reviewed on Rounds  Physical Exam:  Vitals: Temperature 98.2 pulse 86 respiratory 21 blood pressure 122/78 saturations 100%  Ventilator Settings off the ventilator on T collar FiO2 28%  . General: Comfortable at this time . Eyes: Grossly normal lids, irises & conjunctiva . ENT: grossly tongue is normal . Neck: no obvious mass . Cardiovascular: S1 S2 normal no gallop . Respiratory: No rhonchi no rales. . Abdomen: soft . Skin: no rash seen on limited exam . Musculoskeletal: not rigid . Psychiatric:unable to assess . Neurologic: no seizure no involuntary movements         Lab Data:   Basic Metabolic Panel: Recent Labs  Lab 04/13/19 1430 04/14/19 0533 04/19/19 0538  NA 138 138 135  K 3.4* 4.3 4.8  CL 99 96* 97*  CO2 29 32 29  GLUCOSE 126* 113* 95  BUN 23 24* 27*  CREATININE 1.22 1.33* 1.12  CALCIUM 8.0* 8.4* 8.3*    ABG: No results for input(s): PHART, PCO2ART, PO2ART, HCO3, O2SAT in the last 168 hours.  Liver Function Tests: Recent Labs  Lab 04/13/19 1430  AST 65*  ALT 60*  ALKPHOS 74  BILITOT 1.0  PROT 4.7*  ALBUMIN 1.8*   No results for input(s): LIPASE, AMYLASE in the last 168 hours. No results for input(s): AMMONIA in the last 168 hours.  CBC: Recent Labs  Lab 04/13/19 1430 04/14/19 0533 04/19/19 0538  WBC 12.3* 9.7 5.3  HGB 9.4* 9.8* 9.3*  HCT 28.8* 30.9* 29.6*  MCV 92.6 96.0 95.2  PLT 205 232 197    Cardiac Enzymes: No results for input(s): CKTOTAL, CKMB, CKMBINDEX, TROPONINI in the last 168  hours.  BNP (last 3 results) No results for input(s): BNP in the last 8760 hours.  ProBNP (last 3 results) No results for input(s): PROBNP in the last 8760 hours.  Radiological Exams: No results found.  Assessment/Plan Active Problems:   Acute on chronic respiratory failure with hypoxia (HCC)   Acute on chronic systolic heart failure (HCC)   Chronic atrial fibrillation (HCC)   Tracheal stenosis   Aspiration pneumonia due to gastric secretions ([place])   1. Acute on chronic respiratory failure with hypoxia patient is on baseline settings of the T collar right now has only requiring 28% FiO2 we will continue with pulmonary toilet supportive care 2. Acute on chronic systolic heart failure compensated we will continue with present management 3. Chronic A. fib-rate controlled 4. Tracheal stenosis at baseline 5. Aspiration pneumonia treated we will continue to follow   I have personally seen and evaluated the patient, evaluated laboratory and imaging results, formulated the assessment and plan and placed orders. The Patient requires high complexity decision making for assessment and support.  Case was discussed on Rounds with the Respiratory Therapy Staff  Allyne Gee, MD [facility] Pulmonary Critical Care Medicine Sleep Medicine

## 2019-04-21 DIAGNOSIS — J690 Pneumonitis due to inhalation of food and vomit: Secondary | ICD-10-CM | POA: Diagnosis not present

## 2019-04-21 DIAGNOSIS — I5023 Acute on chronic systolic (congestive) heart failure: Secondary | ICD-10-CM | POA: Diagnosis not present

## 2019-04-21 DIAGNOSIS — I482 Chronic atrial fibrillation, unspecified: Secondary | ICD-10-CM | POA: Diagnosis not present

## 2019-04-21 DIAGNOSIS — J9621 Acute and chronic respiratory failure with hypoxia: Secondary | ICD-10-CM | POA: Diagnosis not present

## 2019-04-21 NOTE — Progress Notes (Signed)
Pulmonary Critical Care Medicine [place]   PULMONARY CRITICAL CARE SERVICE  PROGRESS NOTE  Date of Service: 04/21/2019  Phillip Whitaker  [id number]  DOB: [birth year]   DOA: 04/12/2019  Referring Physician: Merton Border, MD  HPI: Phillip Whitaker is a 73 y.o. male seen for follow up of Acute on Chronic Respiratory Failure.  Patient is on 28% FiO2 currently on T collar good saturations are noted  Medications: Reviewed on Rounds  Physical Exam:  Vitals: Temperature 98.2 pulse 94 respiratory rate 31 blood pressure 100/71 saturations 100%  Ventilator Settings off the ventilator currently on T collar with an FiO2 of 28%  . General: Comfortable at this time . Eyes: Grossly normal lids, irises & conjunctiva . ENT: grossly tongue is normal . Neck: no obvious mass . Cardiovascular: S1 S2 normal no gallop . Respiratory: No rhonchi coarse breath sounds are noted at this time . Abdomen: soft . Skin: no rash seen on limited exam . Musculoskeletal: not rigid . Psychiatric:unable to assess . Neurologic: no seizure no involuntary movements         Lab Data:   Basic Metabolic Panel: Recent Labs  Lab 04/19/19 0538  NA 135  K 4.8  CL 97*  CO2 29  GLUCOSE 95  BUN 27*  CREATININE 1.12  CALCIUM 8.3*    ABG: No results for input(s): PHART, PCO2ART, PO2ART, HCO3, O2SAT in the last 168 hours.  Liver Function Tests: No results for input(s): AST, ALT, ALKPHOS, BILITOT, PROT, ALBUMIN in the last 168 hours. No results for input(s): LIPASE, AMYLASE in the last 168 hours. No results for input(s): AMMONIA in the last 168 hours.  CBC: Recent Labs  Lab 04/19/19 0538  WBC 5.3  HGB 9.3*  HCT 29.6*  MCV 95.2  PLT 197    Cardiac Enzymes: No results for input(s): CKTOTAL, CKMB, CKMBINDEX, TROPONINI in the last 168 hours.  BNP (last 3 results) No results for input(s): BNP in the last 8760 hours.  ProBNP (last 3 results) No results for input(s):  PROBNP in the last 8760 hours.  Radiological Exams: No results found.  Assessment/Plan Active Problems:   Acute on chronic respiratory failure with hypoxia (HCC)   Acute on chronic systolic heart failure (HCC)   Chronic atrial fibrillation (HCC)   Tracheal stenosis   Aspiration pneumonia due to gastric secretions ([place])   1. Acute on chronic respiratory failure hypoxia patient continues on T collar continue secretion management pulmonary toilet. 2. Acute on chronic systolic heart failure compensated 3. Chronic atrial fibrillation rate is controlled 4. Tracheal stenosis at baseline we will continue to follow 5. Aspiration pneumonia treated we will continue with supportive care   I have personally seen and evaluated the patient, evaluated laboratory and imaging results, formulated the assessment and plan and placed orders. The Patient requires high complexity decision making for assessment and support.  Case was discussed on Rounds with the Respiratory Therapy Staff  Allyne Gee, MD [facility] Pulmonary Critical Care Medicine Sleep Medicine

## 2019-04-22 DIAGNOSIS — J9621 Acute and chronic respiratory failure with hypoxia: Secondary | ICD-10-CM | POA: Diagnosis not present

## 2019-04-22 DIAGNOSIS — J690 Pneumonitis due to inhalation of food and vomit: Secondary | ICD-10-CM | POA: Diagnosis not present

## 2019-04-22 DIAGNOSIS — I482 Chronic atrial fibrillation, unspecified: Secondary | ICD-10-CM | POA: Diagnosis not present

## 2019-04-22 DIAGNOSIS — I5023 Acute on chronic systolic (congestive) heart failure: Secondary | ICD-10-CM | POA: Diagnosis not present

## 2019-04-22 NOTE — Progress Notes (Signed)
Pulmonary Critical Care Medicine [place]   PULMONARY CRITICAL CARE SERVICE  PROGRESS NOTE  Date of Service: 04/22/2019  Phillip Whitaker  [id number]  DOB: May 29, 1945   DOA: 04/12/2019  Referring Physician: Merton Border, MD  HPI: Phillip Whitaker is a 73 y.o. male seen for follow up of Acute on Chronic Respiratory Failure.  Patient currently is on T collar has been on 20% FiO2 good saturations are noted at this time  Medications: Reviewed on Rounds  Physical Exam:  Vitals: Temperature 97.4 pulse 84 respiratory rate 16 blood pressure 90/53 saturations 100%  Ventilator Settings currently on T collar with an FiO2 of 28%  . General: Comfortable at this time . Eyes: Grossly normal lids, irises & conjunctiva . ENT: grossly tongue is normal . Neck: no obvious mass . Cardiovascular: S1 S2 normal no gallop . Respiratory: No rhonchi coarse breath sounds are noted . Abdomen: soft . Skin: no rash seen on limited exam . Musculoskeletal: not rigid . Psychiatric:unable to assess . Neurologic: no seizure no involuntary movements         Lab Data:   Basic Metabolic Panel: Recent Labs  Lab 04/19/19 0538  NA 135  K 4.8  CL 97*  CO2 29  GLUCOSE 95  BUN 27*  CREATININE 1.12  CALCIUM 8.3*    ABG: No results for input(s): PHART, PCO2ART, PO2ART, HCO3, O2SAT in the last 168 hours.  Liver Function Tests: No results for input(s): AST, ALT, ALKPHOS, BILITOT, PROT, ALBUMIN in the last 168 hours. No results for input(s): LIPASE, AMYLASE in the last 168 hours. No results for input(s): AMMONIA in the last 168 hours.  CBC: Recent Labs  Lab 04/19/19 0538  WBC 5.3  HGB 9.3*  HCT 29.6*  MCV 95.2  PLT 197    Cardiac Enzymes: No results for input(s): CKTOTAL, CKMB, CKMBINDEX, TROPONINI in the last 168 hours.  BNP (last 3 results) No results for input(s): BNP in the last 8760 hours.  ProBNP (last 3 results) No results for input(s): PROBNP in  the last 8760 hours.  Radiological Exams: No results found.  Assessment/Plan Active Problems:   Acute on chronic respiratory failure with hypoxia (HCC)   Acute on chronic systolic heart failure (HCC)   Chronic atrial fibrillation (HCC)   Tracheal stenosis   Aspiration pneumonia due to gastric secretions ([place])   1. Acute on chronic respiratory failure hypoxia plan continue with T collar trials titrate oxygen continue pulmonary toilet 2. Acute on chronic systolic heart failure baseline 3. Chronic atrial fibrillation rate controlled 4. Aspiration pneumonia treated slowly improving we will continue to follow 5. Tracheal stenosis patient is at baseline will need follow-up after discharge   I have personally seen and evaluated the patient, evaluated laboratory and imaging results, formulated the assessment and plan and placed orders. The Patient requires high complexity decision making for assessment and support.  Case was discussed on Rounds with the Respiratory Therapy Staff  Allyne Gee, MD [facility] Pulmonary Critical Care Medicine Sleep Medicine

## 2019-04-23 DIAGNOSIS — I5023 Acute on chronic systolic (congestive) heart failure: Secondary | ICD-10-CM | POA: Diagnosis not present

## 2019-04-23 DIAGNOSIS — J690 Pneumonitis due to inhalation of food and vomit: Secondary | ICD-10-CM | POA: Diagnosis not present

## 2019-04-23 DIAGNOSIS — I482 Chronic atrial fibrillation, unspecified: Secondary | ICD-10-CM | POA: Diagnosis not present

## 2019-04-23 DIAGNOSIS — J9621 Acute and chronic respiratory failure with hypoxia: Secondary | ICD-10-CM | POA: Diagnosis not present

## 2019-04-23 LAB — BASIC METABOLIC PANEL
Anion gap: 9 (ref 5–15)
BUN: 33 mg/dL — ABNORMAL HIGH (ref 8–23)
CO2: 32 mmol/L (ref 22–32)
Calcium: 9.2 mg/dL (ref 8.9–10.3)
Chloride: 98 mmol/L (ref 98–111)
Creatinine, Ser: 1.36 mg/dL — ABNORMAL HIGH (ref 0.61–1.24)
GFR calc Af Amer: 59 mL/min — ABNORMAL LOW (ref >60)
GFR calc non Af Amer: 51 mL/min — ABNORMAL LOW (ref >60)
Glucose, Bld: 97 mg/dL (ref 70–99)
Potassium: 5.3 mmol/L — ABNORMAL HIGH (ref 3.5–5.1)
Sodium: 139 mmol/L (ref 135–145)

## 2019-04-23 LAB — CBC
HCT: 31.9 % — ABNORMAL LOW (ref 39.0–52.0)
Hemoglobin: 10.1 g/dL — ABNORMAL LOW (ref 13.0–17.0)
MCH: 30.9 pg (ref 26.0–34.0)
MCHC: 31.7 g/dL (ref 30.0–36.0)
MCV: 97.6 fL (ref 80.0–100.0)
Platelets: 203 10*3/uL (ref 150–400)
RBC: 3.27 MIL/uL — ABNORMAL LOW (ref 4.22–5.81)
RDW: 17 % — ABNORMAL HIGH (ref 11.5–15.5)
WBC: 4.4 10*3/uL (ref 4.0–10.5)
nRBC: 0 % (ref 0.0–0.2)

## 2019-04-23 LAB — MAGNESIUM: Magnesium: 2.5 mg/dL — ABNORMAL HIGH (ref 1.7–2.4)

## 2019-04-23 NOTE — Progress Notes (Signed)
Pulmonary Critical Care Medicine [place]   PULMONARY CRITICAL CARE SERVICE  PROGRESS NOTE  Date of Service: 04/23/2019  Loki Wuthrich  IEP:[id number]  DOB: 24-Sep-1945   DOA: 04/12/2019  Referring Physician: Merton Border, MD  HPI: Phillip Whitaker is a 73 y.o. male seen for follow up of Acute on Chronic Respiratory Failure.  Patient currently is on T collar has been on 28% FiO2 using PMV  Medications: Reviewed on Rounds  Physical Exam:  Vitals: Temperature 97.1 pulse 80 respiratory rate 16 blood pressure is 169/75 saturations 100%  Ventilator Settings off the ventilator on T collar with an FiO2 of 28% using PMV  . General: Comfortable at this time . Eyes: Grossly normal lids, irises & conjunctiva . ENT: grossly tongue is normal . Neck: no obvious mass . Cardiovascular: S1 S2 normal no gallop . Respiratory: No rhonchi no rales are noted at this time . Abdomen: soft . Skin: no rash seen on limited exam . Musculoskeletal: not rigid . Psychiatric:unable to assess . Neurologic: no seizure no involuntary movements         Lab Data:   Basic Metabolic Panel: Recent Labs  Lab 04/19/19 0538 04/23/19 0628  NA 135 139  K 4.8 5.3*  CL 97* 98  CO2 29 32  GLUCOSE 95 97  BUN 27* 33*  CREATININE 1.12 1.36*  CALCIUM 8.3* 9.2  MG  --  2.5*    ABG: No results for input(s): PHART, PCO2ART, PO2ART, HCO3, O2SAT in the last 168 hours.  Liver Function Tests: No results for input(s): AST, ALT, ALKPHOS, BILITOT, PROT, ALBUMIN in the last 168 hours. No results for input(s): LIPASE, AMYLASE in the last 168 hours. No results for input(s): AMMONIA in the last 168 hours.  CBC: Recent Labs  Lab 04/19/19 0538 04/23/19 0628  WBC 5.3 4.4  HGB 9.3* 10.1*  HCT 29.6* 31.9*  MCV 95.2 97.6  PLT 197 203    Cardiac Enzymes: No results for input(s): CKTOTAL, CKMB, CKMBINDEX, TROPONINI in the last 168 hours.  BNP (last 3 results) No results for  input(s): BNP in the last 8760 hours.  ProBNP (last 3 results) No results for input(s): PROBNP in the last 8760 hours.  Radiological Exams: No results found.  Assessment/Plan Active Problems:   Acute on chronic respiratory failure with hypoxia (HCC)   Acute on chronic systolic heart failure (HCC)   Chronic atrial fibrillation (HCC)   Tracheal stenosis   Aspiration pneumonia due to gastric secretions ([place])   1. Acute on chronic respiratory failure with hypoxia plan is to continue with T collar tolerating PMV which will be continued right now is on 20% FiO2 2. Acute on chronic systolic heart failure at baseline we will continue to follow 3. Chronic atrial fibrillation rate is controlled 4. Tracheal stenosis at baseline continue present management 5. Aspiration pneumonia clinically improving   I have personally seen and evaluated the patient, evaluated laboratory and imaging results, formulated the assessment and plan and placed orders. The Patient requires high complexity decision making for assessment and support.  Case was discussed on Rounds with the Respiratory Therapy Staff  Allyne Gee, MD [facility] Pulmonary Critical Care Medicine Sleep Medicine

## 2019-04-24 DIAGNOSIS — J9621 Acute and chronic respiratory failure with hypoxia: Secondary | ICD-10-CM | POA: Diagnosis not present

## 2019-04-24 DIAGNOSIS — J690 Pneumonitis due to inhalation of food and vomit: Secondary | ICD-10-CM | POA: Diagnosis not present

## 2019-04-24 DIAGNOSIS — I482 Chronic atrial fibrillation, unspecified: Secondary | ICD-10-CM | POA: Diagnosis not present

## 2019-04-24 DIAGNOSIS — I5023 Acute on chronic systolic (congestive) heart failure: Secondary | ICD-10-CM | POA: Diagnosis not present

## 2019-04-24 NOTE — Progress Notes (Signed)
Pulmonary Critical Care Medicine [place]   PULMONARY CRITICAL CARE SERVICE  PROGRESS NOTE  Date of Service: 04/24/2019  Phillip Whitaker  [id number]  DOB: 1945/10/16   DOA: 04/12/2019  Referring Physician: Merton Border, MD  HPI: Phillip Whitaker is a 73 y.o. male seen for follow up of Acute on Chronic Respiratory Failure.  Patient has been capping for more than 24 hours actually doing quite well.  However holding off on any decannulation for now until we actually can get ENT to evaluate the patient.  Medications: Reviewed on Rounds  Physical Exam:  Vitals: Temperature 97.0 pulse 80 respiratory rate is 18 blood pressure 116/71 saturations 97%  Ventilator Settings capping off the ventilator  . General: Comfortable at this time . Eyes: Grossly normal lids, irises & conjunctiva . ENT: grossly tongue is normal . Neck: no obvious mass . Cardiovascular: S1 S2 normal no gallop . Respiratory: No rhonchi no rales are noted at this time . Abdomen: soft . Skin: no rash seen on limited exam . Musculoskeletal: not rigid . Psychiatric:unable to assess . Neurologic: no seizure no involuntary movements         Lab Data:   Basic Metabolic Panel: Recent Labs  Lab 04/19/19 0538 04/23/19 0628  NA 135 139  K 4.8 5.3*  CL 97* 98  CO2 29 32  GLUCOSE 95 97  BUN 27* 33*  CREATININE 1.12 1.36*  CALCIUM 8.3* 9.2  MG  --  2.5*    ABG: No results for input(s): PHART, PCO2ART, PO2ART, HCO3, O2SAT in the last 168 hours.  Liver Function Tests: No results for input(s): AST, ALT, ALKPHOS, BILITOT, PROT, ALBUMIN in the last 168 hours. No results for input(s): LIPASE, AMYLASE in the last 168 hours. No results for input(s): AMMONIA in the last 168 hours.  CBC: Recent Labs  Lab 04/19/19 0538 04/23/19 0628  WBC 5.3 4.4  HGB 9.3* 10.1*  HCT 29.6* 31.9*  MCV 95.2 97.6  PLT 197 203    Cardiac Enzymes: No results for input(s): CKTOTAL, CKMB, CKMBINDEX,  TROPONINI in the last 168 hours.  BNP (last 3 results) No results for input(s): BNP in the last 8760 hours.  ProBNP (last 3 results) No results for input(s): PROBNP in the last 8760 hours.  Radiological Exams: No results found.  Assessment/Plan Active Problems:   Acute on chronic respiratory failure with hypoxia (HCC)   Acute on chronic systolic heart failure (HCC)   Chronic atrial fibrillation (HCC)   Tracheal stenosis   Aspiration pneumonia due to gastric secretions ([place])   1. Acute on chronic respiratory failure hypoxia plan is to continue with capping trials as ordered patient is doing well with 24-hour completed 2. Acute on chronic systolic heart failure compensated we will continue with supportive care 3. Chronic atrial fibrillation rate controlled 4. Aspiration pneumonia treated resolved 5. Tracheal stenosis will have ENT reassess prior to decannulation   I have personally seen and evaluated the patient, evaluated laboratory and imaging results, formulated the assessment and plan and placed orders. The Patient requires high complexity decision making for assessment and support.  Case was discussed on Rounds with the Respiratory Therapy Staff  Allyne Gee, MD [facility] Pulmonary Critical Care Medicine Sleep Medicine

## 2019-04-25 ENCOUNTER — Other Ambulatory Visit (HOSPITAL_COMMUNITY): Payer: Medicare Other

## 2019-04-25 DIAGNOSIS — I5023 Acute on chronic systolic (congestive) heart failure: Secondary | ICD-10-CM | POA: Diagnosis not present

## 2019-04-25 DIAGNOSIS — J690 Pneumonitis due to inhalation of food and vomit: Secondary | ICD-10-CM | POA: Diagnosis not present

## 2019-04-25 DIAGNOSIS — J9621 Acute and chronic respiratory failure with hypoxia: Secondary | ICD-10-CM | POA: Diagnosis not present

## 2019-04-25 DIAGNOSIS — I482 Chronic atrial fibrillation, unspecified: Secondary | ICD-10-CM | POA: Diagnosis not present

## 2019-04-25 LAB — URINALYSIS, ROUTINE W REFLEX MICROSCOPIC
Bilirubin Urine: NEGATIVE
Glucose, UA: NEGATIVE mg/dL
Ketones, ur: NEGATIVE mg/dL
Nitrite: NEGATIVE
Protein, ur: 100 mg/dL — AB
Specific Gravity, Urine: 1.013 (ref 1.005–1.030)
WBC, UA: >50 WBC/hpf — ABNORMAL HIGH (ref 0–5)
pH: 9 — ABNORMAL HIGH (ref 5.0–8.0)

## 2019-04-25 LAB — BASIC METABOLIC PANEL
Anion gap: 11 (ref 5–15)
BUN: 33 mg/dL — ABNORMAL HIGH (ref 8–23)
CO2: 29 mmol/L (ref 22–32)
Calcium: 9.1 mg/dL (ref 8.9–10.3)
Chloride: 98 mmol/L (ref 98–111)
Creatinine, Ser: 1.44 mg/dL — ABNORMAL HIGH (ref 0.61–1.24)
GFR calc Af Amer: 55 mL/min — ABNORMAL LOW (ref >60)
GFR calc non Af Amer: 47 mL/min — ABNORMAL LOW (ref >60)
Glucose, Bld: 160 mg/dL — ABNORMAL HIGH (ref 70–99)
Potassium: 4.5 mmol/L (ref 3.5–5.1)
Sodium: 138 mmol/L (ref 135–145)

## 2019-04-25 NOTE — Progress Notes (Signed)
Pulmonary Critical Care Medicine [place]   PULMONARY CRITICAL CARE SERVICE  PROGRESS NOTE  Date of Service: 04/25/2019  Phillip Whitaker  CZY:[id number]  DOB: 06-16-1945   DOA: 04/12/2019  Referring Physician: Merton Border, MD  HPI: Phillip Whitaker is a 73 y.o. male seen for follow up of Acute on Chronic Respiratory Failure.  Patient has been capping for 48 hours did actually quite well.  I also did review the notes from [facility] regarding his tracheostomy and his history of subglottic stenosis.  As seen by ENT at the other facility patient had developed stridor was evaluated showed bilateral vocal fold hypomobility and there was felt to be posterior glottic scarring.  At that time patient was intubated and placed on the ventilator subsequently had a tracheostomy done.  The note on the discharge is copied and pasted below from the discharge summary of November 26  # Tracheal Stenosis  Phillip Whitaker was initially admitted for volume overload due to heart failure exacerbation, and was diuresed with IV lasix to euvolemia. After receiving IV diuresis, he was noted to have secretions and worsening hypoxia. On the morning on 11/12, he acutely developed stridor. 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 He then underwent tracheostomy on 11/13. Per the operative report, he was noted to have "edematous true and false vocal folds which were closed even after paralytic. The Dedo laryngoscope was able to pass through the cords. Upon palpation, it appeared that the arytenoids were relatively fixed." This was concerning for tracheal stenosis. After his tracheostomy was performed, he was noted to be oxygenating well on trach mask at 14 L O2. SLP evaluated the patient due to recent tracheostomy, and it was determined that he should remain NPO, with nutrition requirements met via tube feeds.  Therefore, he underwent PEG tube placement, and was restarted on tube feeds, as detailed below. He should follow up with ENT within 4 weeks of discharge for further management of his tracheostomy.    Medications: Reviewed on Rounds  Physical Exam:  Vitals: Temperature 97.8 pulse 83 respiratory 24 blood pressure 114/66 saturations 98%  Ventilator Settings currently capping has been capping for 48 hours  . General: Comfortable at this time . Eyes: Grossly normal lids, irises & conjunctiva . ENT: grossly tongue is normal . Neck: no obvious mass . Cardiovascular: S1 S2 normal no gallop . Respiratory: No rhonchi no rales noted at this time . Abdomen: soft . Skin: no rash seen on limited exam . Musculoskeletal: not rigid . Psychiatric:unable to assess . Neurologic: no seizure no involuntary movements         Lab Data:   Basic Metabolic Panel: Recent Labs  Lab 04/19/19 0538 04/23/19 0628 04/25/19 0343  NA 135 139 138  K 4.8 5.3* 4.5  CL 97* 98 98  CO2 29 32 29  GLUCOSE 95 97 160*  BUN 27* 33* 33*  CREATININE 1.12 1.36* 1.44*  CALCIUM 8.3* 9.2 9.1  MG  --  2.5*  --     ABG: No results for input(s): PHART, PCO2ART, PO2ART, HCO3, O2SAT in the last 168 hours.  Liver Function Tests: No results for input(s): AST, ALT, ALKPHOS, BILITOT, PROT, ALBUMIN in the last 168 hours. No results for input(s): LIPASE, AMYLASE in the last 168 hours. No results for input(s): AMMONIA in the last 168 hours.  CBC: Recent Labs  Lab 04/19/19 0538 04/23/19 0628  WBC 5.3 4.4  HGB 9.3* 10.1*  HCT 29.6* 31.9*  MCV 95.2 97.6  PLT 197 203    Cardiac Enzymes: No results for input(s): CKTOTAL, CKMB, CKMBINDEX, TROPONINI in the last 168 hours.  BNP (last 3 results) No results for input(s): BNP in the last 8760 hours.  ProBNP (last 3 results) No results for input(s): PROBNP in the last 8760 hours.  Radiological Exams: No results found.  Assessment/Plan Active Problems:   Acute on  chronic respiratory failure with hypoxia (HCC)   Acute on chronic systolic heart failure (HCC)   Chronic atrial fibrillation (HCC)   Tracheal stenosis   Aspiration pneumonia due to gastric secretions ([place])   1. Acute on chronic respiratory failure with hypoxia plan is to continue with capping trials and based on the finding of the discharge summary I have requested for ENT consultation to evaluate need to reassess.  Currently patient is actually doing very well with the capping there does not appear to be any stridor etc.  We will however hold off on decannulation for now. 2. Acute on chronic systolic heart failure right now is compensated continue with supportive care 3. Chronic atrial fibrillation rate controlled 4. Tracheal stenosis subglottic stenosis as per ENT note from transferring facility above we will get another ENT evaluation here 5. Aspiration pneumonia treated we will continue with on supportive care this is clinically improved   I have personally seen and evaluated the patient, evaluated laboratory and imaging results, formulated the assessment and plan and placed orders.  Time 35 minutes The Patient requires high complexity decision making for assessment and support.  Case was discussed on Rounds with the Respiratory Therapy Staff  Allyne Gee, MD [facility] Pulmonary Critical Care Medicine Sleep Medicine

## 2019-04-26 DIAGNOSIS — I5023 Acute on chronic systolic (congestive) heart failure: Secondary | ICD-10-CM | POA: Diagnosis not present

## 2019-04-26 DIAGNOSIS — J9621 Acute and chronic respiratory failure with hypoxia: Secondary | ICD-10-CM | POA: Diagnosis not present

## 2019-04-26 DIAGNOSIS — J690 Pneumonitis due to inhalation of food and vomit: Secondary | ICD-10-CM | POA: Diagnosis not present

## 2019-04-26 DIAGNOSIS — I482 Chronic atrial fibrillation, unspecified: Secondary | ICD-10-CM | POA: Diagnosis not present

## 2019-04-26 DIAGNOSIS — J386 Stenosis of larynx: Secondary | ICD-10-CM

## 2019-04-26 NOTE — Progress Notes (Signed)
Pulmonary Critical Care Medicine [place]   PULMONARY CRITICAL CARE SERVICE  PROGRESS NOTE  Date of Service: 04/26/2019  Phillip Whitaker  NUU:[id number]  DOB: 09/14/1945   DOA: 04/12/2019  Referring Physician: Merton Border, MD  HPI: Phillip Whitaker is a 73 y.o. male seen for follow up of Acute on Chronic Respiratory Failure.  Capping right now comfortable without distress has been on room air  Medications: Reviewed on Rounds  Physical Exam:  Vitals: Temperature 96.7 pulse 82 respiratory rate 21 blood pressure 93/60 saturations 98%  Ventilator Settings capping of the ventilator  . General: Comfortable at this time . Eyes: Grossly normal lids, irises & conjunctiva . ENT: grossly tongue is normal . Neck: no obvious mass . Cardiovascular: S1 S2 normal no gallop . Respiratory: No rhonchi no rales are noted at this time . Abdomen: soft . Skin: no rash seen on limited exam . Musculoskeletal: not rigid . Psychiatric:unable to assess . Neurologic: no seizure no involuntary movements         Lab Data:   Basic Metabolic Panel: Recent Labs  Lab 04/23/19 0628 04/25/19 0343  NA 139 138  K 5.3* 4.5  CL 98 98  CO2 32 29  GLUCOSE 97 160*  BUN 33* 33*  CREATININE 1.36* 1.44*  CALCIUM 9.2 9.1  MG 2.5*  --     ABG: No results for input(s): PHART, PCO2ART, PO2ART, HCO3, O2SAT in the last 168 hours.  Liver Function Tests: No results for input(s): AST, ALT, ALKPHOS, BILITOT, PROT, ALBUMIN in the last 168 hours. No results for input(s): LIPASE, AMYLASE in the last 168 hours. No results for input(s): AMMONIA in the last 168 hours.  CBC: Recent Labs  Lab 04/23/19 0628  WBC 4.4  HGB 10.1*  HCT 31.9*  MCV 97.6  PLT 203    Cardiac Enzymes: No results for input(s): CKTOTAL, CKMB, CKMBINDEX, TROPONINI in the last 168 hours.  BNP (last 3 results) No results for input(s): BNP in the last 8760 hours.  ProBNP (last 3 results) No results for  input(s): PROBNP in the last 8760 hours.  Radiological Exams: DG CHEST PORT 1 VIEW  Result Date: 04/25/2019 CLINICAL DATA:  Shortness of breath today. EXAM: PORTABLE CHEST 1 VIEW COMPARISON:  Single-view of the chest 04/12/2019. FINDINGS: Tracheostomy tube is in place in good position. Pacing device again seen. Lungs are clear. Marked cardiomegaly. Aortic atherosclerosis. The patient is status post CABG. IMPRESSION: No acute disease. Marked cardiomegaly. Atherosclerosis. Electronically Signed   By: Phillip Whitaker M.D.   On: 04/25/2019 10:19    Assessment/Plan Active Problems:   Acute on chronic respiratory failure with hypoxia (HCC)   Acute on chronic systolic heart failure (HCC)   Chronic atrial fibrillation (HCC)   Tracheal stenosis   Aspiration pneumonia due to gastric secretions ([place])   1. Acute on chronic respiratory failure with hypoxia patient is actually been doing fine with the capping trials over is not supposed to be decannulated because of tracheal issues.  I have requested an ENT consultation to be done for follow-up. 2. Acute on chronic systolic heart failure compensated continue present management 3. Chronic atrial fibrillation rate is controlled 4. Tracheal stenosis as above will be getting a ENT consultation for evaluation 5. Aspiration pneumonia treated we will continue with supportive care   I have personally seen and evaluated the patient, evaluated laboratory and imaging results, formulated the assessment and plan and placed orders. The Patient requires high complexity decision making for  assessment and support.  Case was discussed on Rounds with the Respiratory Therapy Staff  Phillip Gee, MD [facility] Pulmonary Critical Care Medicine Sleep Medicine

## 2019-04-27 DIAGNOSIS — J690 Pneumonitis due to inhalation of food and vomit: Secondary | ICD-10-CM | POA: Diagnosis not present

## 2019-04-27 DIAGNOSIS — J9621 Acute and chronic respiratory failure with hypoxia: Secondary | ICD-10-CM | POA: Diagnosis not present

## 2019-04-27 DIAGNOSIS — I5023 Acute on chronic systolic (congestive) heart failure: Secondary | ICD-10-CM | POA: Diagnosis not present

## 2019-04-27 DIAGNOSIS — I482 Chronic atrial fibrillation, unspecified: Secondary | ICD-10-CM | POA: Diagnosis not present

## 2019-04-27 LAB — URINE CULTURE: Culture: >=100000 — AB

## 2019-04-27 NOTE — Progress Notes (Signed)
Pulmonary Critical Care Medicine [place]   PULMONARY CRITICAL CARE SERVICE  PROGRESS NOTE  Date of Service: 04/27/2019  Phillip Whitaker  KNL:[id number]  DOB: March 14, 1946   DOA: 04/12/2019  Referring Physician: Merton Border, MD  HPI: Phillip Whitaker is a 73 y.o. male seen for follow up of Acute on Chronic Respiratory Failure.  Patient currently is on T collar with the PMV.  Was seen by ENT and the recommendations are not to decannulate.  He had had been also having some stridor issues so that has resolved with uncapping  Medications: Reviewed on Rounds  Physical Exam:  Vitals: Temperature 94.5 pulse 80 respiratory 16 blood pressure 101/52 saturations 97%  Ventilator Settings on T collar currently on 28% FiO2 using PMV  . General: Comfortable at this time . Eyes: Grossly normal lids, irises & conjunctiva . ENT: grossly tongue is normal . Neck: no obvious mass . Cardiovascular: S1 S2 normal no gallop . Respiratory: No rhonchi no rales are noted at this time . Abdomen: soft . Skin: no rash seen on limited exam . Musculoskeletal: not rigid . Psychiatric:unable to assess . Neurologic: no seizure no involuntary movements         Lab Data:   Basic Metabolic Panel: Recent Labs  Lab 04/23/19 0628 04/25/19 0343  NA 139 138  K 5.3* 4.5  CL 98 98  CO2 32 29  GLUCOSE 97 160*  BUN 33* 33*  CREATININE 1.36* 1.44*  CALCIUM 9.2 9.1  MG 2.5*  --     ABG: No results for input(s): PHART, PCO2ART, PO2ART, HCO3, O2SAT in the last 168 hours.  Liver Function Tests: No results for input(s): AST, ALT, ALKPHOS, BILITOT, PROT, ALBUMIN in the last 168 hours. No results for input(s): LIPASE, AMYLASE in the last 168 hours. No results for input(s): AMMONIA in the last 168 hours.  CBC: Recent Labs  Lab 04/23/19 0628  WBC 4.4  HGB 10.1*  HCT 31.9*  MCV 97.6  PLT 203    Cardiac Enzymes: No results for input(s): CKTOTAL, CKMB, CKMBINDEX, TROPONINI  in the last 168 hours.  BNP (last 3 results) No results for input(s): BNP in the last 8760 hours.  ProBNP (last 3 results) No results for input(s): PROBNP in the last 8760 hours.  Radiological Exams: No results found.  Assessment/Plan Active Problems:   Acute on chronic respiratory failure with hypoxia (HCC)   Acute on chronic systolic heart failure (HCC)   Chronic atrial fibrillation (HCC)   Tracheal stenosis   Aspiration pneumonia due to gastric secretions ([place])   1. Acute on chronic respiratory failure with hypoxia patient continues on T collar has been on 28% FiO2 with the PMV in place 2. Acute on chronic systolic heart failure we will continue with present management 3. Chronic atrial fibrillation rate controlled 4. Aspiration pneumonia treated we will continue with supportive care   I have personally seen and evaluated the patient, evaluated laboratory and imaging results, formulated the assessment and plan and placed orders. The Patient requires high complexity decision making for assessment and support.  Case was discussed on Rounds with the Respiratory Therapy Staff  Allyne Gee, MD [facility] Pulmonary Critical Care Medicine Sleep Medicine

## 2019-04-27 NOTE — Consult Note (Signed)
Infectious Disease Consultation   Phillip SahaWillard Frank Tillett  ZOX:[id number]  DOB: 11-26-1945  DOA: 04/12/2019  Requesting physician: Dr. Sharyon MedicusHijazi  Reason for consultation: Antibiotic recommendations   History of Present Illness: Phillip Whitaker is an 73 y.o. male with multiple medical problems including coronary disease, atrial fibrillation, history of CABG 21 years ago, pacemaker, severe mitral regurgitation, congestive heart failure with EF 25%, cardiomyopathy, chronic kidney disease stage III who was admitted to the acute hospital for worsening shortness of breath thought to be secondary to exacerbation of congestive heart failure requiring intubation and ventilation. He had laryngoscopy by ENT and was noted to have vocal cord hypomobility and there was also some posterior glottic scarring for which he underwent tracheostomy.  His outside hospital course was also complicated by aspiration, pneumonia and sepsis for which she was already treated.  After admission here he was found to have leukocytosis.  Cultures showed UTI with E. coli, Klebsiella pneumonia.  Currently started on treatment with IV cefepime.  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  Currently his trach is capped.  He is oriented x3.  Complaining of generalized aches and pains.  He denies having any nausea, vomiting, chest pain, abdominal pain, diarrhea or dysuria.  He is complaining of some mild shortness of breath.  Review of Systems:  Review of systems negative except as mentioned above in HPI   Past Medical History: Past Medical History:  Diagnosis Date  . Acute on chronic respiratory failure with hypoxia (HCC)   . Acute on chronic systolic heart failure (HCC)   . Aspiration pneumonia due to gastric secretions (HCC)   . Chronic atrial fibrillation (HCC)   . Tracheal stenosis     Allergies: Codeine  Social History: Marland Kitchen. Marital status: Divorced  Spouse name: Not on file  . Number of children: Not on  file  . Years of education: Not on file  . Highest education level: Not on file  Occupational History  . Not on file  Social Needs  . Financial resource strain: Not on file  . Food insecurity  Worry: Not on file  Inability: Not on file  . Transportation needs  Medical: Not on file  Non-medical: Not on file  Tobacco Use  . Smoking status: Never Smoker  . Smokeless tobacco: Never Used   Family History: . Diabetes Mother  . Stroke Mother  . Hypermobility Mother  . Hyperlipidemia Mother  . Diabetes Father  . Heart disease Father  . Kidney disease Father  . Hypertension Father  . No Known Problems Sister  . No Known Problems Brother    Physical Exam: Vitals: Temperature 100.4, pulse 81, respiratory rate 20, blood pressure 109/62, pulse oximetry 98% Constitutional: Thin, frail-appearing male, not in any acute distress at this time, alert and oriented x3 Head: Atraumatic, normocephalic Eyes: PERLA, EOMI, irises appear normal, anicteric sclera,  ENMT: external ears and nose appear normal, Lips appears normal  Neck: Has trach in place, no masses, normal ROM  CVS: S1, S2, murmur  Respiratory: decreased breath sounds lower lobes, no rhonchi or wheezing abdomen: soft nontender, nondistended, normal bowel sounds Musculoskeletal: No edema Neuro: He has generalized weakness with debility otherwise grossly nonfocal Psych: judgement and insight appear normal, stable mood and affect, mental status Skin: no rashes   Data reviewed:  I have personally reviewed following labs and imaging studies Labs:  CBC: Recent Labs  Lab 04/23/19 0628  WBC 4.4  HGB 10.1*  HCT 31.9*  MCV 97.6  PLT 409    Basic Metabolic Panel: Recent Labs  Lab 04/23/19 0628 04/25/19 0343  NA 139 138  K 5.3* 4.5  CL 98 98  CO2 32 29  GLUCOSE 97 160*  BUN 33* 33*  CREATININE 1.36* 1.44*  CALCIUM 9.2 9.1  MG 2.5*  --    GFR CrCl cannot be calculated (Unknown ideal weight.). Liver Function  Tests: No results for input(s): AST, ALT, ALKPHOS, BILITOT, PROT, ALBUMIN in the last 168 hours. No results for input(s): LIPASE, AMYLASE in the last 168 hours. No results for input(s): AMMONIA in the last 168 hours. Coagulation profile No results for input(s): INR, PROTIME in the last 168 hours.  Cardiac Enzymes: No results for input(s): CKTOTAL, CKMB, CKMBINDEX, TROPONINI in the last 168 hours. BNP: Invalid input(s): POCBNP CBG: No results for input(s): GLUCAP in the last 168 hours. D-Dimer No results for input(s): DDIMER in the last 72 hours. Hgb A1c No results for input(s): HGBA1C in the last 72 hours. Lipid Profile No results for input(s): CHOL, HDL, LDLCALC, TRIG, CHOLHDL, LDLDIRECT in the last 72 hours. Thyroid function studies No results for input(s): TSH, T4TOTAL, T3FREE, THYROIDAB in the last 72 hours.  Invalid input(s): FREET3 Anemia work up No results for input(s): VITAMINB12, FOLATE, FERRITIN, TIBC, IRON, RETICCTPCT in the last 72 hours. Urinalysis    Component Value Date/Time   COLORURINE YELLOW 04/25/2019 1215   APPEARANCEUR CLOUDY (A) 04/25/2019 1215   LABSPEC 1.013 04/25/2019 1215   PHURINE 9.0 (H) 04/25/2019 1215   GLUCOSEU NEGATIVE 04/25/2019 1215   HGBUR MODERATE (A) 04/25/2019 1215   BILIRUBINUR NEGATIVE 04/25/2019 1215   KETONESUR NEGATIVE 04/25/2019 1215   PROTEINUR 100 (A) 04/25/2019 1215   NITRITE NEGATIVE 04/25/2019 1215   LEUKOCYTESUR LARGE (A) 04/25/2019 1215     Microbiology Recent Results (from the past 240 hour(s))  Culture, Urine     Status: Abnormal   Collection Time: 04/25/19 12:00 PM   Specimen: Urine, Random  Result Value Ref Range Status   Specimen Description URINE, RANDOM  Final   Special Requests   Final    NONE Performed at [facility], [place] [address] [place], [place] [address]    Culture (A)  Final    >=100,000 COLONIES/mL ESCHERICHIA COLI >=100,000 COLONIES/mL KLEBSIELLA PNEUMONIAE    Report Status 04/27/2019  FINAL  Final   Organism ID, Bacteria ESCHERICHIA COLI (A)  Final   Organism ID, Bacteria KLEBSIELLA PNEUMONIAE (A)  Final      Susceptibility   Escherichia coli - MIC*    AMPICILLIN >=32 RESISTANT Resistant     CEFAZOLIN <=4 SENSITIVE Sensitive     CEFTRIAXONE <=1 SENSITIVE Sensitive     CIPROFLOXACIN 1 SENSITIVE Sensitive     GENTAMICIN <=1 SENSITIVE Sensitive     IMIPENEM <=0.25 SENSITIVE Sensitive     NITROFURANTOIN <=16 SENSITIVE Sensitive     TRIMETH/SULFA <=20 SENSITIVE Sensitive     AMPICILLIN/SULBACTAM 16 INTERMEDIATE Intermediate     PIP/TAZO <=4 SENSITIVE Sensitive     * >=100,000 COLONIES/mL ESCHERICHIA COLI   Klebsiella pneumoniae - MIC*    AMPICILLIN >=32 RESISTANT Resistant     CEFAZOLIN <=4 SENSITIVE Sensitive     CEFTRIAXONE <=1 SENSITIVE Sensitive     CIPROFLOXACIN <=0.25 SENSITIVE Sensitive     GENTAMICIN <=1 SENSITIVE Sensitive     IMIPENEM <=0.25 SENSITIVE Sensitive     NITROFURANTOIN 64 INTERMEDIATE Intermediate     TRIMETH/SULFA <=20 SENSITIVE Sensitive     AMPICILLIN/SULBACTAM 4 SENSITIVE Sensitive  PIP/TAZO <=4 SENSITIVE Sensitive     * >=100,000 COLONIES/mL KLEBSIELLA PNEUMONIAE       Inpatient Medications:   Scheduled Meds: Please see MAR  Radiological Exams on Admission: No results found.  Impression/Recommendations Active Problems:   Acute on chronic respiratory failure with hypoxia (HCC)   Acute on chronic systolic heart failure (HCC)   Chronic atrial fibrillation (HCC)   Tracheal stenosis   Aspiration pneumonia due to gastric secretions (HCC) Urinary tract infection with E. 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  Pulmonary following.  Per pulmonary he had some stridor issues that resolved  with uncapping.  Continue further management per pulmonary.  He previously had aspiration pneumonia.  Due to his dysphagia he is at high risk for recurrent aspiration pneumonia.  If his respiratory status worsens would recommend to obtain chest imaging preferably chest CT without contrast to better evaluate.  Urinary tract infection: Urine culture showing E. coli, Klebsiella pneumonia.  He has been started with IV cefepime per the primary team.  Will switch to IV ceftriaxone.  We will plan to treat for duration of 10 days.  Acute on chronic systolic congestive heart failure: Patient had EF 25%.  Appears compensated at this time.  Further management per the primary team.  Acute on chronic renal failure: Continue to monitor BUN/trending closely.  Avoid nephrotoxic medication.  Further management per primary team.  Antibiotics renally dosed.  Dysphagia: Due to his dysphagia he is very high risk for aspiration and worsening respiratory failure secondary to aspiration pneumonia.  Currently has a PEG tube in place.  Protein calorie malnutrition: Management per primary team.  Due to his complex medical problems he is high risk for worsening and decompensation.  Thank you for involving [place] in the care of this patient.  Vonzella Nipple M.D. 04/27/2019, 1:58 PM

## 2019-04-28 DIAGNOSIS — J690 Pneumonitis due to inhalation of food and vomit: Secondary | ICD-10-CM | POA: Diagnosis not present

## 2019-04-28 DIAGNOSIS — I482 Chronic atrial fibrillation, unspecified: Secondary | ICD-10-CM | POA: Diagnosis not present

## 2019-04-28 DIAGNOSIS — I5023 Acute on chronic systolic (congestive) heart failure: Secondary | ICD-10-CM | POA: Diagnosis not present

## 2019-04-28 DIAGNOSIS — J9621 Acute and chronic respiratory failure with hypoxia: Secondary | ICD-10-CM | POA: Diagnosis not present

## 2019-04-28 NOTE — Progress Notes (Signed)
Pulmonary Critical Care Medicine [place]   PULMONARY CRITICAL CARE SERVICE  PROGRESS NOTE  Date of Service: 04/28/2019  Phillip Whitaker  NTI:[id number]  DOB: Dec 04, 1945   DOA: 04/12/2019  Referring Physician: Merton Border, MD  HPI: Phillip Whitaker is a 73 y.o. male seen for follow up of Acute on Chronic Respiratory Failure.  He is doing well with the T collar no issues as already previously noted there is no plan to do any decannulation  Medications: Reviewed on Rounds  Physical Exam:  Vitals: Temperature 98.2 pulse 80 respiratory rate 20 blood pressure was 90/55 saturations 96%  Ventilator Settings off the ventilator on T collar with 28% FiO2  . General: Comfortable at this time . Eyes: Grossly normal lids, irises & conjunctiva . ENT: grossly tongue is normal . Neck: no obvious mass . Cardiovascular: S1 S2 normal no gallop . Respiratory: No rhonchi no rales are noted at this time . Abdomen: soft . Skin: no rash seen on limited exam . Musculoskeletal: not rigid . Psychiatric:unable to assess . Neurologic: no seizure no involuntary movements         Lab Data:   Basic Metabolic Panel: Recent Labs  Lab 04/23/19 0628 04/25/19 0343  NA 139 138  K 5.3* 4.5  CL 98 98  CO2 32 29  GLUCOSE 97 160*  BUN 33* 33*  CREATININE 1.36* 1.44*  CALCIUM 9.2 9.1  MG 2.5*  --     ABG: No results for input(s): PHART, PCO2ART, PO2ART, HCO3, O2SAT in the last 168 hours.  Liver Function Tests: No results for input(s): AST, ALT, ALKPHOS, BILITOT, PROT, ALBUMIN in the last 168 hours. No results for input(s): LIPASE, AMYLASE in the last 168 hours. No results for input(s): AMMONIA in the last 168 hours.  CBC: Recent Labs  Lab 04/23/19 0628  WBC 4.4  HGB 10.1*  HCT 31.9*  MCV 97.6  PLT 203    Cardiac Enzymes: No results for input(s): CKTOTAL, CKMB, CKMBINDEX, TROPONINI in the last 168 hours.  BNP (last 3 results) No results for input(s):  BNP in the last 8760 hours.  ProBNP (last 3 results) No results for input(s): PROBNP in the last 8760 hours.  Radiological Exams: No results found.  Assessment/Plan Active Problems:   Acute on chronic respiratory failure with hypoxia (HCC)   Acute on chronic systolic heart failure (HCC)   Chronic atrial fibrillation (HCC)   Tracheal stenosis   Aspiration pneumonia due to gastric secretions ([place])   1. Acute on chronic respiratory failure with hypoxia plan is to continue with the T collar patient is doing well at this time with T collar trials.  No plan for decannulation 2. Acute on chronic systolic heart failure compensated we will continue with present management 3. Chronic atrial fibrillation rate controlled 4. Tracheal stenosis no change has been evaluated by ENT 5. Aspiration pneumonia treated we will continue with supportive care   I have personally seen and evaluated the patient, evaluated laboratory and imaging results, formulated the assessment and plan and placed orders. The Patient requires high complexity decision making for assessment and support.  Case was discussed on Rounds with the Respiratory Therapy Staff  Allyne Gee, MD [facility] Pulmonary Critical Care Medicine Sleep Medicine

## 2019-04-29 ENCOUNTER — Other Ambulatory Visit (HOSPITAL_COMMUNITY): Payer: Medicare Other

## 2019-04-29 DIAGNOSIS — J690 Pneumonitis due to inhalation of food and vomit: Secondary | ICD-10-CM | POA: Diagnosis not present

## 2019-04-29 DIAGNOSIS — I5023 Acute on chronic systolic (congestive) heart failure: Secondary | ICD-10-CM | POA: Diagnosis not present

## 2019-04-29 DIAGNOSIS — I482 Chronic atrial fibrillation, unspecified: Secondary | ICD-10-CM | POA: Diagnosis not present

## 2019-04-29 DIAGNOSIS — J9621 Acute and chronic respiratory failure with hypoxia: Secondary | ICD-10-CM | POA: Diagnosis not present

## 2019-04-29 NOTE — Progress Notes (Signed)
Pulmonary Critical Care Medicine [place]   PULMONARY CRITICAL CARE SERVICE  PROGRESS NOTE  Date of Service: 04/29/2019  [name]  EPP:[id number]  DOB: 07-14-1945   DOA: 04/12/2019  Referring Physician: Merton Border, MD  HPI: Phillip Whitaker is a 73 y.o. male seen for follow up of Acute on Chronic Respiratory Failure.  Patient is at his baseline on T collar currently is on 20% FiO2.  Secretions are fair to moderate and he is not to be decannulated as already discussed will need follow-up with his ENT after discharge  Medications: Reviewed on Rounds  Physical Exam:  Vitals: Temperature 97.1 pulse 89 respiratory rate 15 blood pressure 94/58 saturations 100%  Ventilator Settings currently on T collar on 28% FiO2  . General: Comfortable at this time . Eyes: Grossly normal lids, irises & conjunctiva . ENT: grossly tongue is normal . Neck: no obvious mass . Cardiovascular: S1 S2 normal no gallop . Respiratory: No rhonchi no rales are noted at this time . Abdomen: soft . Skin: no rash seen on limited exam . Musculoskeletal: not rigid . Psychiatric:unable to assess . Neurologic: no seizure no involuntary movements         Lab Data:   Basic Metabolic Panel: Recent Labs  Lab 04/23/19 0628 04/25/19 0343  NA 139 138  K 5.3* 4.5  CL 98 98  CO2 32 29  GLUCOSE 97 160*  BUN 33* 33*  CREATININE 1.36* 1.44*  CALCIUM 9.2 9.1  MG 2.5*  --     ABG: No results for input(s): PHART, PCO2ART, PO2ART, HCO3, O2SAT in the last 168 hours.  Liver Function Tests: No results for input(s): AST, ALT, ALKPHOS, BILITOT, PROT, ALBUMIN in the last 168 hours. No results for input(s): LIPASE, AMYLASE in the last 168 hours. No results for input(s): AMMONIA in the last 168 hours.  CBC: Recent Labs  Lab 04/23/19 0628  WBC 4.4  HGB 10.1*  HCT 31.9*  MCV 97.6  PLT 203    Cardiac Enzymes: No results for input(s): CKTOTAL, CKMB, CKMBINDEX, TROPONINI  in the last 168 hours.  BNP (last 3 results) No results for input(s): BNP in the last 8760 hours.  ProBNP (last 3 results) No results for input(s): PROBNP in the last 8760 hours.  Radiological Exams: No results found.  Assessment/Plan Active Problems:   Acute on chronic respiratory failure with hypoxia (HCC)   Acute on chronic systolic heart failure (HCC)   Chronic atrial fibrillation (HCC)   Tracheal stenosis   Aspiration pneumonia due to gastric secretions ([place])   1. Acute on chronic respiratory failure hypoxia patient will be continued on T collar trials currently on 28% FiO2 no decannulation planned 2. Acute on chronic systolic heart failure compensated we will continue to monitor fluid status 3. Chronic atrial fibrillation rate controlled 4. Tracheal stenosis as above no decannulation 5. Aspiration pneumonia treated clinically improved   I have personally seen and evaluated the patient, evaluated laboratory and imaging results, formulated the assessment and plan and placed orders. The Patient requires high complexity decision making for assessment and support.  Case was discussed on Rounds with the Respiratory Therapy Staff  Allyne Gee, MD [facility] Pulmonary Critical Care Medicine Sleep Medicine

## 2019-04-30 ENCOUNTER — Institutional Professional Consult (permissible substitution) (HOSPITAL_COMMUNITY): Payer: Medicare Other

## 2019-04-30 ENCOUNTER — Other Ambulatory Visit (HOSPITAL_COMMUNITY): Payer: Medicare Other

## 2019-04-30 DIAGNOSIS — I482 Chronic atrial fibrillation, unspecified: Secondary | ICD-10-CM | POA: Diagnosis not present

## 2019-04-30 DIAGNOSIS — J690 Pneumonitis due to inhalation of food and vomit: Secondary | ICD-10-CM | POA: Diagnosis not present

## 2019-04-30 DIAGNOSIS — J9621 Acute and chronic respiratory failure with hypoxia: Secondary | ICD-10-CM | POA: Diagnosis not present

## 2019-04-30 DIAGNOSIS — I5023 Acute on chronic systolic (congestive) heart failure: Secondary | ICD-10-CM | POA: Diagnosis not present

## 2019-04-30 LAB — BASIC METABOLIC PANEL
Anion gap: 8 (ref 5–15)
BUN: 42 mg/dL — ABNORMAL HIGH (ref 8–23)
CO2: 32 mmol/L (ref 22–32)
Calcium: 8.6 mg/dL — ABNORMAL LOW (ref 8.9–10.3)
Chloride: 98 mmol/L (ref 98–111)
Creatinine, Ser: 1.29 mg/dL — ABNORMAL HIGH (ref 0.61–1.24)
GFR calc Af Amer: >60 mL/min (ref >60)
GFR calc non Af Amer: 54 mL/min — ABNORMAL LOW (ref >60)
Glucose, Bld: 102 mg/dL — ABNORMAL HIGH (ref 70–99)
Potassium: 4.4 mmol/L (ref 3.5–5.1)
Sodium: 138 mmol/L (ref 135–145)

## 2019-04-30 LAB — CBC
HCT: 30.6 % — ABNORMAL LOW (ref 39.0–52.0)
Hemoglobin: 9.6 g/dL — ABNORMAL LOW (ref 13.0–17.0)
MCH: 31.1 pg (ref 26.0–34.0)
MCHC: 31.4 g/dL (ref 30.0–36.0)
MCV: 99 fL (ref 80.0–100.0)
Platelets: 137 10*3/uL — ABNORMAL LOW (ref 150–400)
RBC: 3.09 MIL/uL — ABNORMAL LOW (ref 4.22–5.81)
RDW: 17.2 % — ABNORMAL HIGH (ref 11.5–15.5)
WBC: 2.4 10*3/uL — ABNORMAL LOW (ref 4.0–10.5)
nRBC: 0 % (ref 0.0–0.2)

## 2019-04-30 NOTE — Progress Notes (Signed)
Pulmonary Critical Care Medicine [place]   PULMONARY CRITICAL CARE SERVICE  PROGRESS NOTE  Date of Service: 04/30/2019  Phillip Whitaker  QIO:[id number]  DOB: Aug 01, 1945   DOA: 04/12/2019  Referring Physician: Merton Border, MD  HPI: Phillip Whitaker is a 73 y.o. male seen for follow up of Acute on Chronic Respiratory Failure.  Patient currently is on T collar has been on 28% FiO2 right now has desaturations noted  Medications: Reviewed on Rounds  Physical Exam:  Vitals: Temperature 96.6 pulse 83 respiratory rate is 28 blood pressure 87/47 saturations 98%  Ventilator Settings off the ventilator right now on T collar  . General: Comfortable at this time . Eyes: Grossly normal lids, irises & conjunctiva . ENT: grossly tongue is normal . Neck: no obvious mass . Cardiovascular: S1 S2 normal no gallop . Respiratory: No rhonchi no rales are noted . Abdomen: soft . Skin: no rash seen on limited exam . Musculoskeletal: not rigid . Psychiatric:unable to assess . Neurologic: no seizure no involuntary movements         Lab Data:   Basic Metabolic Panel: Recent Labs  Lab 04/25/19 0343 04/30/19 0734  NA 138 138  K 4.5 4.4  CL 98 98  CO2 29 32  GLUCOSE 160* 102*  BUN 33* 42*  CREATININE 1.44* 1.29*  CALCIUM 9.1 8.6*    ABG: No results for input(s): PHART, PCO2ART, PO2ART, HCO3, O2SAT in the last 168 hours.  Liver Function Tests: No results for input(s): AST, ALT, ALKPHOS, BILITOT, PROT, ALBUMIN in the last 168 hours. No results for input(s): LIPASE, AMYLASE in the last 168 hours. No results for input(s): AMMONIA in the last 168 hours.  CBC: Recent Labs  Lab 04/30/19 0734  WBC 2.4*  HGB 9.6*  HCT 30.6*  MCV 99.0  PLT 137*    Cardiac Enzymes: No results for input(s): CKTOTAL, CKMB, CKMBINDEX, TROPONINI in the last 168 hours.  BNP (last 3 results) No results for input(s): BNP in the last 8760 hours.  ProBNP (last 3  results) No results for input(s): PROBNP in the last 8760 hours.  Radiological Exams: DG Abd 1 View  Result Date: 04/29/2019 CLINICAL DATA:  Possible bowel impaction EXAM: ABDOMEN - 1 VIEW COMPARISON:  04/12/2019 FINDINGS: Scattered large and small bowel gas is noted. No evidence of constipation or fecal impaction is seen. Gastrostomy catheter is noted in the left upper quadrant. Postsurgical changes in the pelvis are noted. Degenerative change of the lumbar spine is seen. IMPRESSION: No changes to suggest bowel impaction. Electronically Signed   By: Inez Catalina M.D.   On: 04/29/2019 18:57   DG Chest Port 1 View  Result Date: 04/30/2019 CLINICAL DATA:  Respiratory failure and tracheostomy EXAM: PORTABLE CHEST 1 VIEW COMPARISON:  Five days ago FINDINGS: Single chamber pacer from the left into the right ventricle. Prior CABG. Cardiomegaly is marked and chronic. Tracheostomy tube in place. Percutaneous gastrostomy tube. Mild retrocardiac atelectasis.  No edema, effusion, or pneumothorax. IMPRESSION: Stable chest.  No evidence of acute disease. Electronically Signed   By: Monte Fantasia M.D.   On: 04/30/2019 07:10    Assessment/Plan Active Problems:   Acute on chronic respiratory failure with hypoxia (HCC)   Acute on chronic systolic heart failure (HCC)   Chronic atrial fibrillation (HCC)   Tracheal stenosis   Aspiration pneumonia due to gastric secretions ([place])   1. Acute on chronic respiratory failure with hypoxia plan is to continue with T collar trials titrate  oxygen continue pulmonary toilet. 2. Acute systolic heart failure compensated continue present management 3. Chronic atrial fibrillation rate controlled 4. Tracheal stenosis follow-up after discharge 5. Aspiration pneumonia treated resolved   I have personally seen and evaluated the patient, evaluated laboratory and imaging results, formulated the assessment and plan and placed orders. The Patient requires high complexity  decision making for assessment and support.  Case was discussed on Rounds with the Respiratory Therapy Staff  Yevonne Pax, MD [facility] Pulmonary Critical Care Medicine Sleep Medicine

## 2019-05-01 DIAGNOSIS — I5023 Acute on chronic systolic (congestive) heart failure: Secondary | ICD-10-CM | POA: Diagnosis not present

## 2019-05-01 DIAGNOSIS — J9621 Acute and chronic respiratory failure with hypoxia: Secondary | ICD-10-CM | POA: Diagnosis not present

## 2019-05-01 DIAGNOSIS — J690 Pneumonitis due to inhalation of food and vomit: Secondary | ICD-10-CM | POA: Diagnosis not present

## 2019-05-01 DIAGNOSIS — I482 Chronic atrial fibrillation, unspecified: Secondary | ICD-10-CM | POA: Diagnosis not present

## 2019-05-01 NOTE — Progress Notes (Signed)
Pulmonary Critical Care Medicine [place]   PULMONARY CRITICAL CARE SERVICE  PROGRESS NOTE  Date of Service: 05/01/2019  Phillip Whitaker  KWI:[id number]  DOB: March 31, 1946   DOA: 04/12/2019  Referring Physician: Merton Border, MD  HPI: Phillip Whitaker is a 73 y.o. male seen for follow up of Acute on Chronic Respiratory Failure.  Patient currently is on T collar at baseline has been on 28% FiO2 using PMV without any issues  Medications: Reviewed on Rounds  Physical Exam:  Vitals: Temperature 96.5 pulse 81 respiratory rate is 18 blood pressure 99/52 saturations 100%  Ventilator Settings off the ventilator on T collar currently on 28% FiO2  . General: Comfortable at this time . Eyes: Grossly normal lids, irises & conjunctiva . ENT: grossly tongue is normal . Neck: no obvious mass . Cardiovascular: S1 S2 normal no gallop . Respiratory: No rhonchi no rales are noted at this time . Abdomen: soft . Skin: no rash seen on limited exam . Musculoskeletal: not rigid . Psychiatric:unable to assess . Neurologic: no seizure no involuntary movements         Lab Data:   Basic Metabolic Panel: Recent Labs  Lab 04/25/19 0343 04/30/19 0734  NA 138 138  K 4.5 4.4  CL 98 98  CO2 29 32  GLUCOSE 160* 102*  BUN 33* 42*  CREATININE 1.44* 1.29*  CALCIUM 9.1 8.6*    ABG: No results for input(s): PHART, PCO2ART, PO2ART, HCO3, O2SAT in the last 168 hours.  Liver Function Tests: No results for input(s): AST, ALT, ALKPHOS, BILITOT, PROT, ALBUMIN in the last 168 hours. No results for input(s): LIPASE, AMYLASE in the last 168 hours. No results for input(s): AMMONIA in the last 168 hours.  CBC: Recent Labs  Lab 04/30/19 0734  WBC 2.4*  HGB 9.6*  HCT 30.6*  MCV 99.0  PLT 137*    Cardiac Enzymes: No results for input(s): CKTOTAL, CKMB, CKMBINDEX, TROPONINI in the last 168 hours.  BNP (last 3 results) No results for input(s): BNP in the last 8760  hours.  ProBNP (last 3 results) No results for input(s): PROBNP in the last 8760 hours.  Radiological Exams: DG Abd 1 View  Result Date: 04/29/2019 CLINICAL DATA:  Possible bowel impaction EXAM: ABDOMEN - 1 VIEW COMPARISON:  04/12/2019 FINDINGS: Scattered large and small bowel gas is noted. No evidence of constipation or fecal impaction is seen. Gastrostomy catheter is noted in the left upper quadrant. Postsurgical changes in the pelvis are noted. Degenerative change of the lumbar spine is seen. IMPRESSION: No changes to suggest bowel impaction. Electronically Signed   By: Inez Catalina M.D.   On: 04/29/2019 18:57   DG Chest Port 1 View  Result Date: 04/30/2019 CLINICAL DATA:  Respiratory failure and tracheostomy EXAM: PORTABLE CHEST 1 VIEW COMPARISON:  Five days ago FINDINGS: Single chamber pacer from the left into the right ventricle. Prior CABG. Cardiomegaly is marked and chronic. Tracheostomy tube in place. Percutaneous gastrostomy tube. Mild retrocardiac atelectasis.  No edema, effusion, or pneumothorax. IMPRESSION: Stable chest.  No evidence of acute disease. Electronically Signed   By: Monte Fantasia M.D.   On: 04/30/2019 07:10    Assessment/Plan Active Problems:   Acute on chronic respiratory failure with hypoxia (HCC)   Acute on chronic systolic heart failure (HCC)   Chronic atrial fibrillation (HCC)   Tracheal stenosis   Aspiration pneumonia due to gastric secretions ([place])   1. Acute on chronic respiratory failure with hypoxia continue with  T collar patient is using PMV at baseline 2. Acute on chronic systolic heart failure compensated based on last chest x-ray 3. Chronic atrial fibrillation rate controlled 4. Tracheal stenosis follow-up after discharge 5. Aspiration pneumonia treated   I have personally seen and evaluated the patient, evaluated laboratory and imaging results, formulated the assessment and plan and placed orders. The Patient requires high complexity  decision making for assessment and support.  Case was discussed on Rounds with the Respiratory Therapy Staff  Yevonne Pax, MD [facility] Pulmonary Critical Care Medicine Sleep Medicine

## 2019-05-02 DIAGNOSIS — I482 Chronic atrial fibrillation, unspecified: Secondary | ICD-10-CM | POA: Diagnosis not present

## 2019-05-02 DIAGNOSIS — I5023 Acute on chronic systolic (congestive) heart failure: Secondary | ICD-10-CM | POA: Diagnosis not present

## 2019-05-02 DIAGNOSIS — J690 Pneumonitis due to inhalation of food and vomit: Secondary | ICD-10-CM | POA: Diagnosis not present

## 2019-05-02 DIAGNOSIS — J9621 Acute and chronic respiratory failure with hypoxia: Secondary | ICD-10-CM | POA: Diagnosis not present

## 2019-05-02 NOTE — Progress Notes (Signed)
Pulmonary Critical Care Medicine [place]   PULMONARY CRITICAL CARE SERVICE  PROGRESS NOTE  Date of Service: 05/02/2019  Phillip Whitaker  ZOX:[id number]  DOB: 1945-05-21   DOA: 04/12/2019  Referring Physician: Merton Border, MD  HPI: Phillip Whitaker is a 73 y.o. male seen for follow up of Acute on Chronic Respiratory Failure.  Patient currently is on T collar has been on 20% FiO2 with good saturations.  Medications: Reviewed on Rounds  Physical Exam:  Vitals: Temperature 96.9 pulse 85 respiratory rate 24 blood pressure one 1/47 saturations 100%  Ventilator Settings off the ventilator on T collar  . General: Comfortable at this time . Eyes: Grossly normal lids, irises & conjunctiva . ENT: grossly tongue is normal . Neck: no obvious mass . Cardiovascular: S1 S2 normal no gallop . Respiratory: No rhonchi no rales are noted at this time . Abdomen: soft . Skin: no rash seen on limited exam . Musculoskeletal: not rigid . Psychiatric:unable to assess . Neurologic: no seizure no involuntary movements         Lab Data:   Basic Metabolic Panel: Recent Labs  Lab 04/30/19 0734  NA 138  K 4.4  CL 98  CO2 32  GLUCOSE 102*  BUN 42*  CREATININE 1.29*  CALCIUM 8.6*    ABG: No results for input(s): PHART, PCO2ART, PO2ART, HCO3, O2SAT in the last 168 hours.  Liver Function Tests: No results for input(s): AST, ALT, ALKPHOS, BILITOT, PROT, ALBUMIN in the last 168 hours. No results for input(s): LIPASE, AMYLASE in the last 168 hours. No results for input(s): AMMONIA in the last 168 hours.  CBC: Recent Labs  Lab 04/30/19 0734  WBC 2.4*  HGB 9.6*  HCT 30.6*  MCV 99.0  PLT 137*    Cardiac Enzymes: No results for input(s): CKTOTAL, CKMB, CKMBINDEX, TROPONINI in the last 168 hours.  BNP (last 3 results) No results for input(s): BNP in the last 8760 hours.  ProBNP (last 3 results) No results for input(s): PROBNP in the last 8760  hours.  Radiological Exams: No results found.  Assessment/Plan Active Problems:   Acute on chronic respiratory failure with hypoxia (HCC)   Acute on chronic systolic heart failure (HCC)   Chronic atrial fibrillation (HCC)   Tracheal stenosis   Aspiration pneumonia due to gastric secretions ([place])   1. Acute on chronic respiratory failure with hypoxia plan is to continue with oxygen therapy titrate as tolerated 2. Acute systolic heart failure right now appears to be compensated 3. Chronic atrial fibrillation rate controlled 4. Tracheal stenosis follow-up after discharge 5. Aspiration pneumonia treated we will continue with supportive care   I have personally seen and evaluated the patient, evaluated laboratory and imaging results, formulated the assessment and plan and placed orders. The Patient requires high complexity decision making for assessment and support.  Case was discussed on Rounds with the Respiratory Therapy Staff  Allyne Gee, MD [facility] Pulmonary Critical Care Medicine Sleep Medicine

## 2019-05-03 DIAGNOSIS — J9621 Acute and chronic respiratory failure with hypoxia: Secondary | ICD-10-CM | POA: Diagnosis not present

## 2019-05-03 DIAGNOSIS — J690 Pneumonitis due to inhalation of food and vomit: Secondary | ICD-10-CM | POA: Diagnosis not present

## 2019-05-03 DIAGNOSIS — I5023 Acute on chronic systolic (congestive) heart failure: Secondary | ICD-10-CM | POA: Diagnosis not present

## 2019-05-03 DIAGNOSIS — I482 Chronic atrial fibrillation, unspecified: Secondary | ICD-10-CM | POA: Diagnosis not present

## 2019-05-03 LAB — SARS CORONAVIRUS 2 (TAT 6-24 HRS): SARS Coronavirus 2: POSITIVE — AB

## 2019-05-03 LAB — CBC
HCT: 28.6 % — ABNORMAL LOW (ref 39.0–52.0)
Hemoglobin: 9 g/dL — ABNORMAL LOW (ref 13.0–17.0)
MCH: 30.4 pg (ref 26.0–34.0)
MCHC: 31.5 g/dL (ref 30.0–36.0)
MCV: 96.6 fL (ref 80.0–100.0)
Platelets: 109 10*3/uL — ABNORMAL LOW (ref 150–400)
RBC: 2.96 MIL/uL — ABNORMAL LOW (ref 4.22–5.81)
RDW: 16.8 % — ABNORMAL HIGH (ref 11.5–15.5)
WBC: 2.4 10*3/uL — ABNORMAL LOW (ref 4.0–10.5)
nRBC: 0 % (ref 0.0–0.2)

## 2019-05-03 LAB — BASIC METABOLIC PANEL
Anion gap: 7 (ref 5–15)
BUN: 35 mg/dL — ABNORMAL HIGH (ref 8–23)
CO2: 31 mmol/L (ref 22–32)
Calcium: 8.4 mg/dL — ABNORMAL LOW (ref 8.9–10.3)
Chloride: 96 mmol/L — ABNORMAL LOW (ref 98–111)
Creatinine, Ser: 1.55 mg/dL — ABNORMAL HIGH (ref 0.61–1.24)
GFR calc Af Amer: 50 mL/min — ABNORMAL LOW (ref >60)
GFR calc non Af Amer: 43 mL/min — ABNORMAL LOW (ref >60)
Glucose, Bld: 128 mg/dL — ABNORMAL HIGH (ref 70–99)
Potassium: 4.4 mmol/L (ref 3.5–5.1)
Sodium: 134 mmol/L — ABNORMAL LOW (ref 135–145)

## 2019-05-03 NOTE — Progress Notes (Signed)
Pulmonary Critical Care Medicine [place]   PULMONARY CRITICAL CARE SERVICE  PROGRESS NOTE  Date of Service: 05/03/2019  Dheeraj Hail  [id number]  DOB: [birth year]   DOA: 04/12/2019  Referring Physician: Merton Border, MD  HPI: Phillip Whitaker is a 73 y.o. male seen for follow up of Acute on Chronic Respiratory Failure.  Patient is on T collar currently 28% FiO2 is using the PMV  Medications: Reviewed on Rounds  Physical Exam:  Vitals: Temperature 98.5 pulse 80 respiratory 15 blood pressure is 131/55 saturations 95%  Ventilator Settings on T collar off the ventilator right now on 28% FiO2 with PMV  . General: Comfortable at this time . Eyes: Grossly normal lids, irises & conjunctiva . ENT: grossly tongue is normal . Neck: no obvious mass . Cardiovascular: S1 S2 normal no gallop . Respiratory: No rhonchi coarse breath sounds are noted . Abdomen: soft . Skin: no rash seen on limited exam . Musculoskeletal: not rigid . Psychiatric:unable to assess . Neurologic: no seizure no involuntary movements         Lab Data:   Basic Metabolic Panel: Recent Labs  Lab 04/30/19 0734 05/03/19 1321  NA 138 134*  K 4.4 4.4  CL 98 96*  CO2 32 31  GLUCOSE 102* 128*  BUN 42* 35*  CREATININE 1.29* 1.55*  CALCIUM 8.6* 8.4*    ABG: No results for input(s): PHART, PCO2ART, PO2ART, HCO3, O2SAT in the last 168 hours.  Liver Function Tests: No results for input(s): AST, ALT, ALKPHOS, BILITOT, PROT, ALBUMIN in the last 168 hours. No results for input(s): LIPASE, AMYLASE in the last 168 hours. No results for input(s): AMMONIA in the last 168 hours.  CBC: Recent Labs  Lab 04/30/19 0734 05/03/19 1321  WBC 2.4* 2.4*  HGB 9.6* 9.0*  HCT 30.6* 28.6*  MCV 99.0 96.6  PLT 137* 109*    Cardiac Enzymes: No results for input(s): CKTOTAL, CKMB, CKMBINDEX, TROPONINI in the last 168 hours.  BNP (last 3 results) No results for input(s): BNP in the  last 8760 hours.  ProBNP (last 3 results) No results for input(s): PROBNP in the last 8760 hours.  Radiological Exams: No results found.  Assessment/Plan Active Problems:   Acute on chronic respiratory failure with hypoxia (HCC)   Acute on chronic systolic heart failure (HCC)   Chronic atrial fibrillation (HCC)   Tracheal stenosis   Aspiration pneumonia due to gastric secretions ([place])   1. Acute on chronic respiratory failure hypoxia plan is to continue T collar trials will titrate oxygen continue pulmonary toilet 2. Acute on chronic systolic heart failure at baseline 3. Chronic atrial fibrillation rate controlled 4. Tracheal stenosis no change 5. Aspiration pneumonia treated clinically improving   I have personally seen and evaluated the patient, evaluated laboratory and imaging results, formulated the assessment and plan and placed orders. The Patient requires high complexity decision making for assessment and support.  Case was discussed on Rounds with the Respiratory Therapy Staff  Allyne Gee, MD [facility] Pulmonary Critical Care Medicine Sleep Medicine

## 2019-05-04 DIAGNOSIS — J690 Pneumonitis due to inhalation of food and vomit: Secondary | ICD-10-CM | POA: Diagnosis not present

## 2019-05-04 DIAGNOSIS — I482 Chronic atrial fibrillation, unspecified: Secondary | ICD-10-CM | POA: Diagnosis not present

## 2019-05-04 DIAGNOSIS — J9621 Acute and chronic respiratory failure with hypoxia: Secondary | ICD-10-CM | POA: Diagnosis not present

## 2019-05-04 DIAGNOSIS — I5023 Acute on chronic systolic (congestive) heart failure: Secondary | ICD-10-CM | POA: Diagnosis not present

## 2019-05-04 NOTE — Progress Notes (Signed)
Pulmonary Critical Care Medicine [place]   PULMONARY CRITICAL CARE SERVICE  PROGRESS NOTE  Date of Service: 05/04/2019  Phillip Whitaker  [id number]  DOB: 26-Aug-1945   DOA: 04/12/2019  Referring Physician: Merton Border, MD  HPI: Phillip Whitaker is a 73 y.o. male seen for follow up of Acute on Chronic Respiratory Failure.  Patient is on the nag device because apparently had a Covid 19 test which came back positive  Medications: Reviewed on Rounds  Physical Exam:  Vitals: Temperature 97.5 pulse 80 respiratory 20 blood pressure 151/61 saturations 97%  Ventilator Settings off the vent on the nag  . General: Comfortable at this time . Eyes: Grossly normal lids, irises & conjunctiva . ENT: grossly tongue is normal . Neck: no obvious mass . Cardiovascular: S1 S2 normal no gallop . Respiratory: No rhonchi no rales are noted at this time . Abdomen: soft . Skin: no rash seen on limited exam . Musculoskeletal: not rigid . Psychiatric:unable to assess . Neurologic: no seizure no involuntary movements         Lab Data:   Basic Metabolic Panel: Recent Labs  Lab 04/30/19 0734 05/03/19 1321  NA 138 134*  K 4.4 4.4  CL 98 96*  CO2 32 31  GLUCOSE 102* 128*  BUN 42* 35*  CREATININE 1.29* 1.55*  CALCIUM 8.6* 8.4*    ABG: No results for input(s): PHART, PCO2ART, PO2ART, HCO3, O2SAT in the last 168 hours.  Liver Function Tests: No results for input(s): AST, ALT, ALKPHOS, BILITOT, PROT, ALBUMIN in the last 168 hours. No results for input(s): LIPASE, AMYLASE in the last 168 hours. No results for input(s): AMMONIA in the last 168 hours.  CBC: Recent Labs  Lab 04/30/19 0734 05/03/19 1321  WBC 2.4* 2.4*  HGB 9.6* 9.0*  HCT 30.6* 28.6*  MCV 99.0 96.6  PLT 137* 109*    Cardiac Enzymes: No results for input(s): CKTOTAL, CKMB, CKMBINDEX, TROPONINI in the last 168 hours.  BNP (last 3 results) No results for input(s): BNP in the last  8760 hours.  ProBNP (last 3 results) No results for input(s): PROBNP in the last 8760 hours.  Radiological Exams: No results found.  Assessment/Plan Active Problems:   Acute on chronic respiratory failure with hypoxia (HCC)   Acute on chronic systolic heart failure (HCC)   Chronic atrial fibrillation (HCC)   Tracheal stenosis   Aspiration pneumonia due to gastric secretions ([place])   1. Acute on chronic respiratory failure hypoxia plan is to continue with nag device because the patient has positive Covid test 2. Acute on chronic systolic heart failure right now is compensated 3. Chronic atrial fibrillation rate controlled 4. Tracheal stenosis at baseline will need follow-up with ENT 5. Aspiration pneumonia treated continue with supportive care   I have personally seen and evaluated the patient, evaluated laboratory and imaging results, formulated the assessment and plan and placed orders. The Patient requires high complexity decision making for assessment and support.  Case was discussed on Rounds with the Respiratory Therapy Staff  Allyne Gee, MD [facility] Pulmonary Critical Care Medicine Sleep Medicine

## 2019-05-05 DIAGNOSIS — J690 Pneumonitis due to inhalation of food and vomit: Secondary | ICD-10-CM | POA: Diagnosis not present

## 2019-05-05 DIAGNOSIS — J9621 Acute and chronic respiratory failure with hypoxia: Secondary | ICD-10-CM | POA: Diagnosis not present

## 2019-05-05 DIAGNOSIS — I5023 Acute on chronic systolic (congestive) heart failure: Secondary | ICD-10-CM | POA: Diagnosis not present

## 2019-05-05 DIAGNOSIS — I482 Chronic atrial fibrillation, unspecified: Secondary | ICD-10-CM | POA: Diagnosis not present

## 2019-05-05 LAB — NOVEL CORONAVIRUS, NAA (HOSP ORDER, SEND-OUT TO REF LAB; TAT 18-24 HRS): SARS-CoV-2, NAA: DETECTED — AB

## 2019-05-05 NOTE — Progress Notes (Signed)
Pulmonary Critical Care Medicine [place]   PULMONARY CRITICAL CARE SERVICE  PROGRESS NOTE  Date of Service: 05/05/2019  Brix Brearley  [id number]  DOB: 1945-11-14   DOA: 04/12/2019  Referring Physician: Merton Border, MD  HPI: Phillip Whitaker is a 73 y.o. male seen for follow up of Acute on Chronic Respiratory Failure.  Patient is capping because of the diagnosis of the Covid he is not to be decannulated as previously discussed.  Seems to be tolerating capping fairly well right now  Medications: Reviewed on Rounds  Physical Exam:  Vitals: Temperature 97.7 pulse 80 respiratory rate 21 blood pressure 96/51 saturations 96%  Ventilator Settings capping right now on room air  . General: Comfortable at this time . Eyes: Grossly normal lids, irises & conjunctiva . ENT: grossly tongue is normal . Neck: no obvious mass . Cardiovascular: S1 S2 normal no gallop . Respiratory: No rhonchi no rales are noted at this time . Abdomen: soft . Skin: no rash seen on limited exam . Musculoskeletal: not rigid . Psychiatric:unable to assess . Neurologic: no seizure no involuntary movements         Lab Data:   Basic Metabolic Panel: Recent Labs  Lab 04/30/19 0734 05/03/19 1321  NA 138 134*  K 4.4 4.4  CL 98 96*  CO2 32 31  GLUCOSE 102* 128*  BUN 42* 35*  CREATININE 1.29* 1.55*  CALCIUM 8.6* 8.4*    ABG: No results for input(s): PHART, PCO2ART, PO2ART, HCO3, O2SAT in the last 168 hours.  Liver Function Tests: No results for input(s): AST, ALT, ALKPHOS, BILITOT, PROT, ALBUMIN in the last 168 hours. No results for input(s): LIPASE, AMYLASE in the last 168 hours. No results for input(s): AMMONIA in the last 168 hours.  CBC: Recent Labs  Lab 04/30/19 0734 05/03/19 1321  WBC 2.4* 2.4*  HGB 9.6* 9.0*  HCT 30.6* 28.6*  MCV 99.0 96.6  PLT 137* 109*    Cardiac Enzymes: No results for input(s): CKTOTAL, CKMB, CKMBINDEX, TROPONINI in the  last 168 hours.  BNP (last 3 results) No results for input(s): BNP in the last 8760 hours.  ProBNP (last 3 results) No results for input(s): PROBNP in the last 8760 hours.  Radiological Exams: No results found.  Assessment/Plan Active Problems:   Acute on chronic respiratory failure with hypoxia (HCC)   Acute on chronic systolic heart failure (HCC)   Chronic atrial fibrillation (HCC)   Tracheal stenosis   Aspiration pneumonia due to gastric secretions ([place])   1. Acute on chronic respiratory failure with hypoxia plan is to continue with capping trials as ordered continue secretion management pulmonary toilet. 2. Acute on chronic systolic heart failure right now appears to be compensated 3. Chronic atrial fibrillation rate controlled 4. Tracheal stenosis no decannulation 5. Aspiration pneumonia treated we will continue to monitor for any further aspiration risk   I have personally seen and evaluated the patient, evaluated laboratory and imaging results, formulated the assessment and plan and placed orders. The Patient requires high complexity decision making for assessment and support.  Case was discussed on Rounds with the Respiratory Therapy Staff  Allyne Gee, MD [facility] Pulmonary Critical Care Medicine Sleep Medicine

## 2019-05-06 DIAGNOSIS — I482 Chronic atrial fibrillation, unspecified: Secondary | ICD-10-CM | POA: Diagnosis not present

## 2019-05-06 DIAGNOSIS — I5023 Acute on chronic systolic (congestive) heart failure: Secondary | ICD-10-CM | POA: Diagnosis not present

## 2019-05-06 DIAGNOSIS — J9621 Acute and chronic respiratory failure with hypoxia: Secondary | ICD-10-CM | POA: Diagnosis not present

## 2019-05-06 DIAGNOSIS — J690 Pneumonitis due to inhalation of food and vomit: Secondary | ICD-10-CM | POA: Diagnosis not present

## 2019-05-06 NOTE — Progress Notes (Signed)
Pulmonary Critical Care Medicine [place]   PULMONARY CRITICAL CARE SERVICE  PROGRESS NOTE  Date of Service: 05/06/2019  Phillip Whitaker  [id number]  DOB: [birth year]   DOA: 04/12/2019  Referring Physician: Merton Border, MD  HPI: Phillip Whitaker is a 73 y.o. male seen for follow up of Acute on Chronic Respiratory Failure.  Patient is capping right now on room air without any distress.  No fevers are noted.  Medications: Reviewed on Rounds  Physical Exam:  Vitals: Temperature 97.2 pulse 80 respiratory rate 12 blood pressure 107/65 saturations 99%  Ventilator Settings capping right now on room air  . General: Comfortable at this time . Eyes: Grossly normal lids, irises & conjunctiva . ENT: grossly tongue is normal . Neck: no obvious mass . Cardiovascular: S1 S2 normal no gallop . Respiratory: No rhonchi no rales are noted at this time . Abdomen: soft . Skin: no rash seen on limited exam . Musculoskeletal: not rigid . Psychiatric:unable to assess . Neurologic: no seizure no involuntary movements         Lab Data:   Basic Metabolic Panel: Recent Labs  Lab 04/30/19 0734 05/03/19 1321  NA 138 134*  K 4.4 4.4  CL 98 96*  CO2 32 31  GLUCOSE 102* 128*  BUN 42* 35*  CREATININE 1.29* 1.55*  CALCIUM 8.6* 8.4*    ABG: No results for input(s): PHART, PCO2ART, PO2ART, HCO3, O2SAT in the last 168 hours.  Liver Function Tests: No results for input(s): AST, ALT, ALKPHOS, BILITOT, PROT, ALBUMIN in the last 168 hours. No results for input(s): LIPASE, AMYLASE in the last 168 hours. No results for input(s): AMMONIA in the last 168 hours.  CBC: Recent Labs  Lab 04/30/19 0734 05/03/19 1321  WBC 2.4* 2.4*  HGB 9.6* 9.0*  HCT 30.6* 28.6*  MCV 99.0 96.6  PLT 137* 109*    Cardiac Enzymes: No results for input(s): CKTOTAL, CKMB, CKMBINDEX, TROPONINI in the last 168 hours.  BNP (last 3 results) No results for input(s): BNP in the last  8760 hours.  ProBNP (last 3 results) No results for input(s): PROBNP in the last 8760 hours.  Radiological Exams: No results found.  Assessment/Plan Active Problems:   Acute on chronic respiratory failure with hypoxia (HCC)   Acute on chronic systolic heart failure (HCC)   Chronic atrial fibrillation (HCC)   Tracheal stenosis   Aspiration pneumonia due to gastric secretions ([place])   1. Acute on chronic respiratory failure hypoxia plan is to continue with capping trials on room air continue secretion management pulmonary toilet. 2. Acute on chronic systolic heart failure at baseline 3. Chronic atrial fibrillation rate is controlled 4. Tracheal stenosis no change 5. Aspiration pneumonia treated we will continue to follow   I have personally seen and evaluated the patient, evaluated laboratory and imaging results, formulated the assessment and plan and placed orders. The Patient requires high complexity decision making for assessment and support.  Case was discussed on Rounds with the Respiratory Therapy Staff  Allyne Gee, MD [facility] Pulmonary Critical Care Medicine Sleep Medicine

## 2019-05-07 DIAGNOSIS — I482 Chronic atrial fibrillation, unspecified: Secondary | ICD-10-CM | POA: Diagnosis not present

## 2019-05-07 DIAGNOSIS — J690 Pneumonitis due to inhalation of food and vomit: Secondary | ICD-10-CM | POA: Diagnosis not present

## 2019-05-07 DIAGNOSIS — J9621 Acute and chronic respiratory failure with hypoxia: Secondary | ICD-10-CM | POA: Diagnosis not present

## 2019-05-07 DIAGNOSIS — I5023 Acute on chronic systolic (congestive) heart failure: Secondary | ICD-10-CM | POA: Diagnosis not present

## 2019-05-07 LAB — CBC
HCT: 31.2 % — ABNORMAL LOW (ref 39.0–52.0)
Hemoglobin: 10 g/dL — ABNORMAL LOW (ref 13.0–17.0)
MCH: 31 pg (ref 26.0–34.0)
MCHC: 32.1 g/dL (ref 30.0–36.0)
MCV: 96.6 fL (ref 80.0–100.0)
Platelets: 164 K/uL (ref 150–400)
RBC: 3.23 MIL/uL — ABNORMAL LOW (ref 4.22–5.81)
RDW: 17.4 % — ABNORMAL HIGH (ref 11.5–15.5)
WBC: 3.9 K/uL — ABNORMAL LOW (ref 4.0–10.5)
nRBC: 0 % (ref 0.0–0.2)

## 2019-05-07 LAB — BASIC METABOLIC PANEL WITH GFR
Anion gap: 7 (ref 5–15)
BUN: 27 mg/dL — ABNORMAL HIGH (ref 8–23)
CO2: 27 mmol/L (ref 22–32)
Calcium: 8.7 mg/dL — ABNORMAL LOW (ref 8.9–10.3)
Chloride: 103 mmol/L (ref 98–111)
Creatinine, Ser: 1.13 mg/dL (ref 0.61–1.24)
GFR calc Af Amer: >60 mL/min (ref >60)
GFR calc non Af Amer: >60 mL/min (ref >60)
Glucose, Bld: 120 mg/dL — ABNORMAL HIGH (ref 70–99)
Potassium: 4.3 mmol/L (ref 3.5–5.1)
Sodium: 137 mmol/L (ref 135–145)

## 2019-05-07 LAB — CULTURE, RESPIRATORY W GRAM STAIN: Culture: NORMAL

## 2019-05-07 NOTE — Progress Notes (Signed)
Pulmonary Critical Care Medicine [place]   PULMONARY CRITICAL CARE SERVICE  PROGRESS NOTE  Date of Service: 05/07/2019  Phillip Whitaker  [id number]  DOB: June 12, 1945   DOA: 04/12/2019  Referring Physician: Merton Border, MD  HPI: Phillip Whitaker is a 73 y.o. male seen for follow up of Acute on Chronic Respiratory Failure.  Patient currently is capping on room air doing well.  Medications: Reviewed on Rounds  Physical Exam:  Vitals: Temperature 97.8 pulse 75 respiratory 18 blood pressure is 103/68 saturations 94%  Ventilator Settings capping off the ventilator  . General: Comfortable at this time . Eyes: Grossly normal lids, irises & conjunctiva . ENT: grossly tongue is normal . Neck: no obvious mass . Cardiovascular: S1 S2 normal no gallop . Respiratory: No rhonchi no rales are noted at this time . Abdomen: soft . Skin: no rash seen on limited exam . Musculoskeletal: not rigid . Psychiatric:unable to assess . Neurologic: no seizure no involuntary movements         Lab Data:   Basic Metabolic Panel: Recent Labs  Lab 05/03/19 1321 05/07/19 0625  NA 134* 137  K 4.4 4.3  CL 96* 103  CO2 31 27  GLUCOSE 128* 120*  BUN 35* 27*  CREATININE 1.55* 1.13  CALCIUM 8.4* 8.7*    ABG: No results for input(s): PHART, PCO2ART, PO2ART, HCO3, O2SAT in the last 168 hours.  Liver Function Tests: No results for input(s): AST, ALT, ALKPHOS, BILITOT, PROT, ALBUMIN in the last 168 hours. No results for input(s): LIPASE, AMYLASE in the last 168 hours. No results for input(s): AMMONIA in the last 168 hours.  CBC: Recent Labs  Lab 05/03/19 1321 05/07/19 0625  WBC 2.4* 3.9*  HGB 9.0* 10.0*  HCT 28.6* 31.2*  MCV 96.6 96.6  PLT 109* 164    Cardiac Enzymes: No results for input(s): CKTOTAL, CKMB, CKMBINDEX, TROPONINI in the last 168 hours.  BNP (last 3 results) No results for input(s): BNP in the last 8760 hours.  ProBNP (last 3  results) No results for input(s): PROBNP in the last 8760 hours.  Radiological Exams: No results found.  Assessment/Plan Active Problems:   Acute on chronic respiratory failure with hypoxia (HCC)   Acute on chronic systolic heart failure (HCC)   Chronic atrial fibrillation (HCC)   Tracheal stenosis   Aspiration pneumonia due to gastric secretions ([place])   1. Acute on chronic respiratory failure hypoxia doing fine with capping trials we will continue to monitor. 2. COVID-19 virus infection tested positive last week we are continue with isolation precautions 3. Chronic atrial fibrillation rate controlled 4. Tracheal stenosis no change not to be decannulated 5. Aspiration pneumonia treated we will continue with supportive care   I have personally seen and evaluated the patient, evaluated laboratory and imaging results, formulated the assessment and plan and placed orders. The Patient requires high complexity decision making for assessment and support.  Case was discussed on Rounds with the Respiratory Therapy Staff  Allyne Gee, MD [facility] Pulmonary Critical Care Medicine Sleep Medicine

## 2020-10-03 IMAGING — DX DG ABDOMEN 1V
1 series · 1 of 1 positions shown · non-contrast
Comparison: None.

CLINICAL DATA: Peg tube placement

EXAM:
ABDOMEN - 1 VIEW

[abdomen]
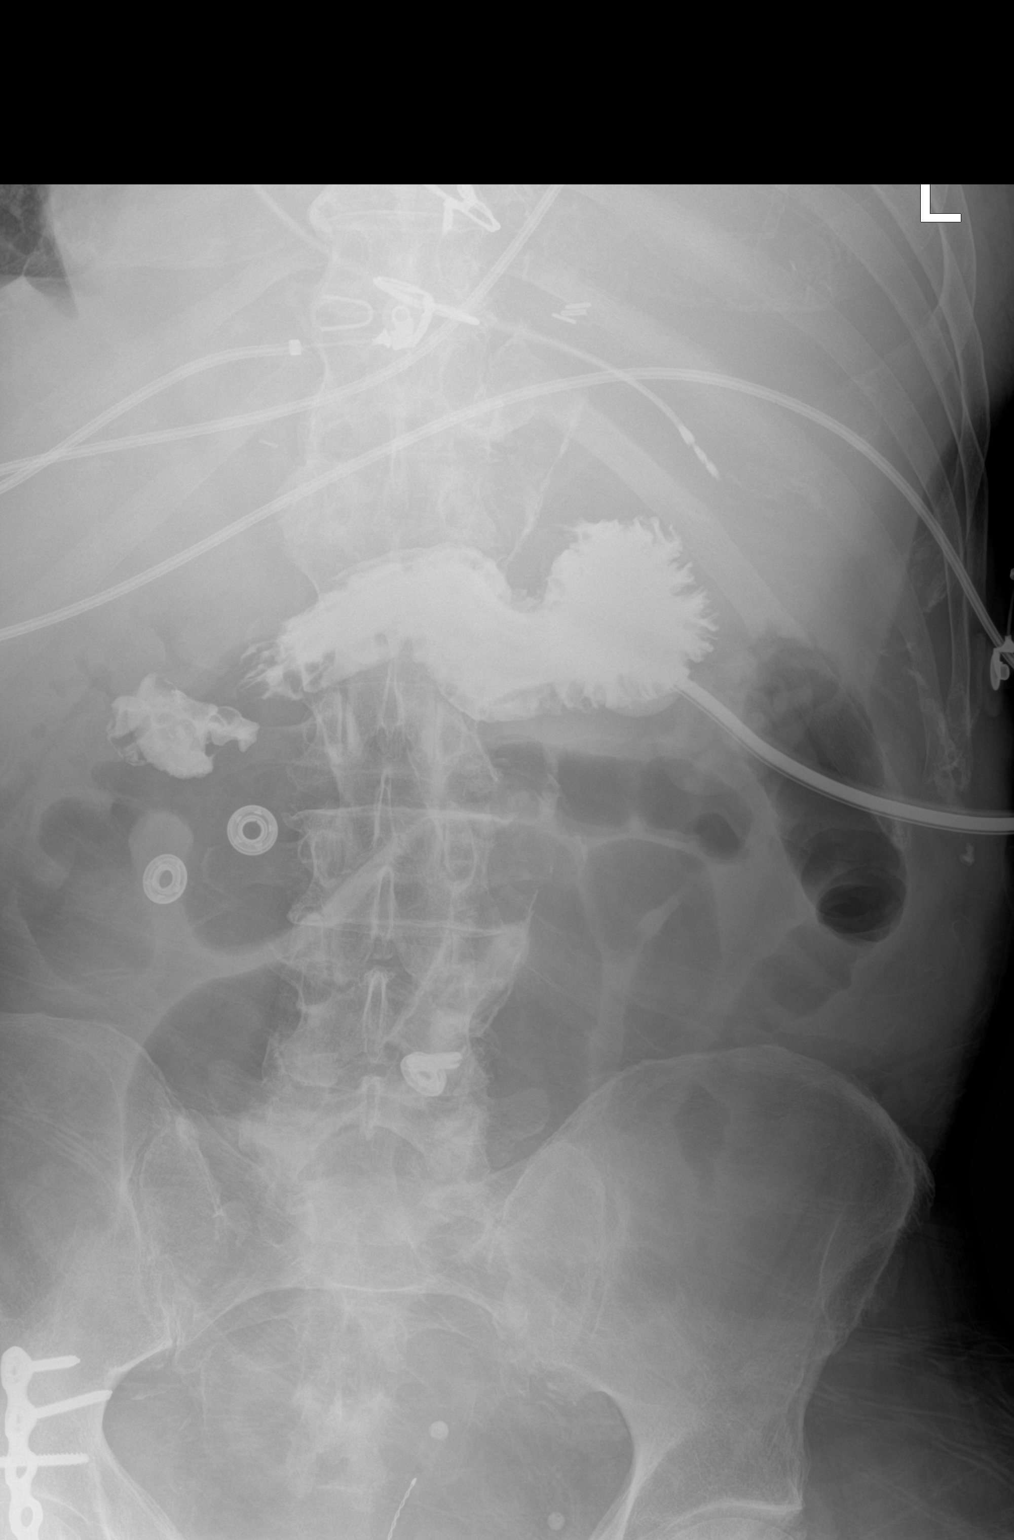

[1 of 1 positions shown; findings below may reference images not displayed]

FINDINGS: Injection through the patient's gastrostomy tube opacifies the
stomach. The bowel gas pattern is nonspecific and nonobstructive.
The partially visualized heart appears significantly enlarged.
IMPRESSION: Injection of contrast through the gastrostomy tube opacifies the
stomach.

## 2020-10-16 IMAGING — DX DG CHEST 1V PORT
1 series · 1 of 1 positions shown · non-contrast
Comparison: Single-view of the chest 04/12/2019.

CLINICAL DATA: Shortness of breath today.

EXAM:
PORTABLE CHEST 1 VIEW

[chest ap]
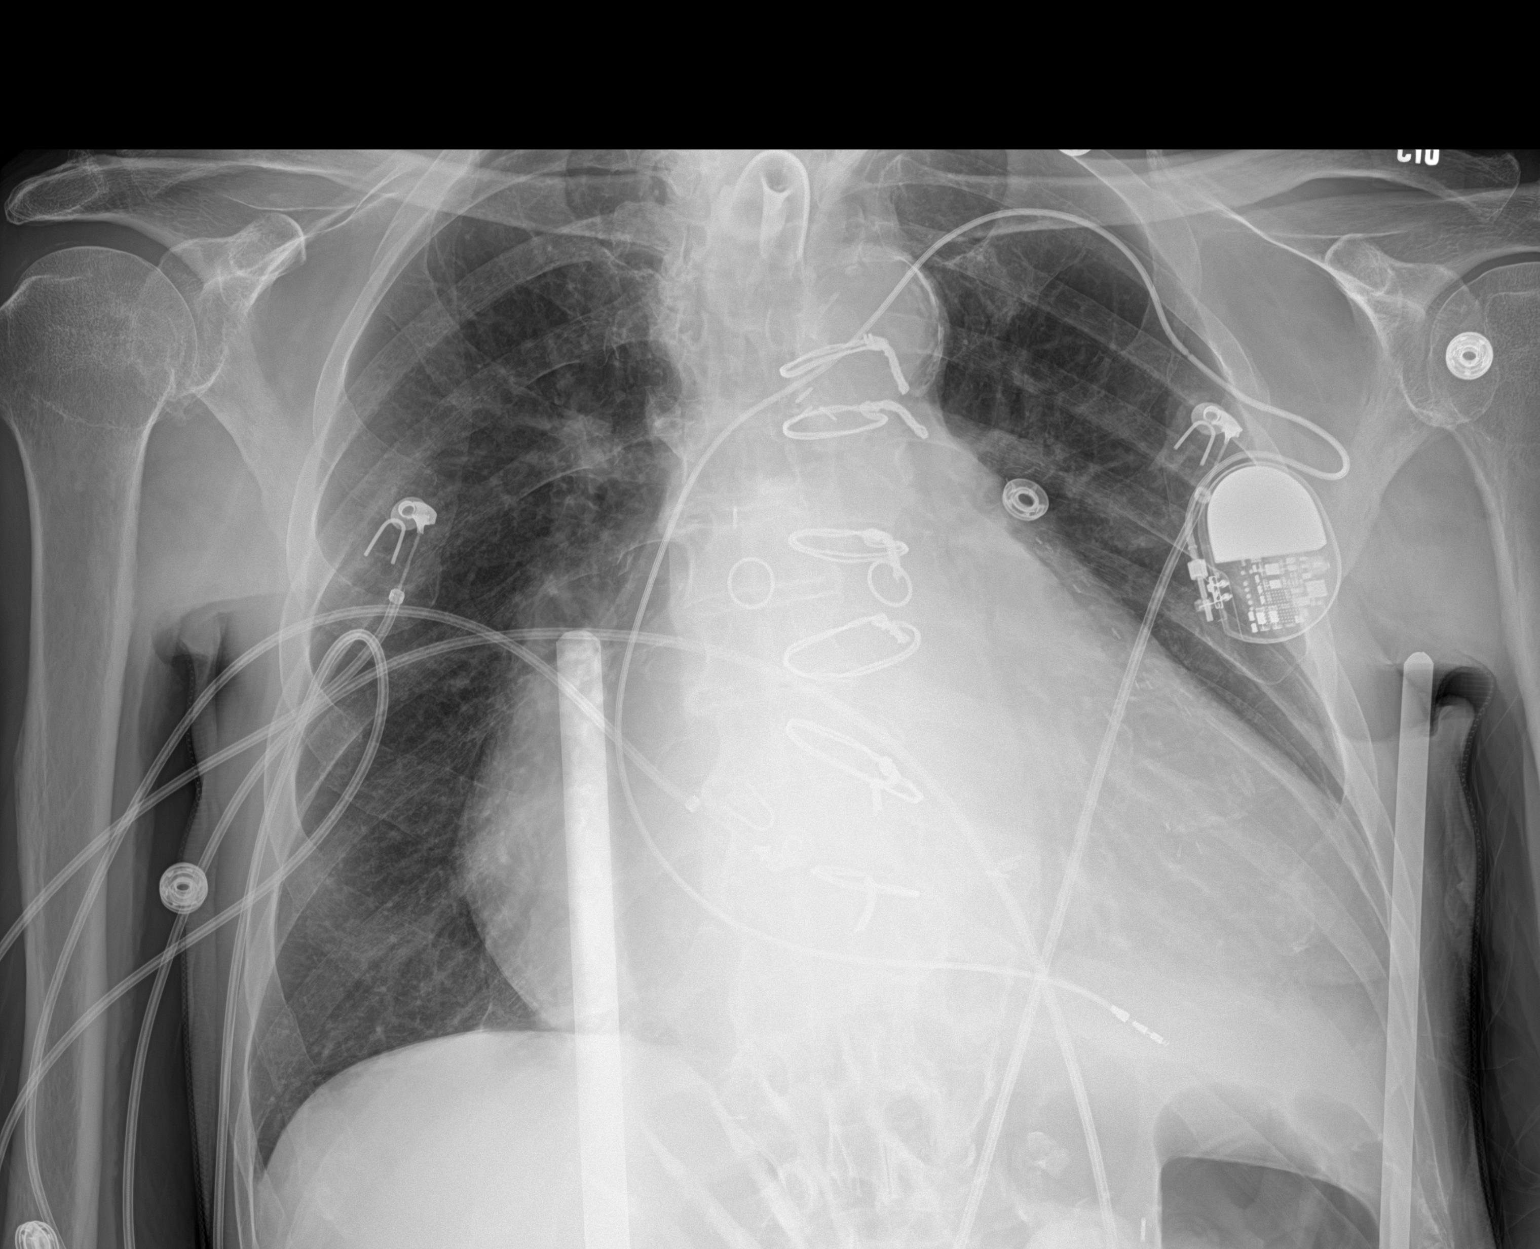

[1 of 1 positions shown; findings below may reference images not displayed]

FINDINGS: Tracheostomy tube is in place in good position. Pacing device again
seen. Lungs are clear. Marked cardiomegaly. Aortic atherosclerosis.
The patient is status post CABG.
IMPRESSION: No acute disease.

Marked cardiomegaly.

Atherosclerosis.

## 2020-10-20 IMAGING — DX DG ABDOMEN 1V
1 series · 1 of 1 positions shown · non-contrast
Comparison: 04/12/2019

CLINICAL DATA: Possible bowel impaction

EXAM:
ABDOMEN - 1 VIEW

[abdomen kub]
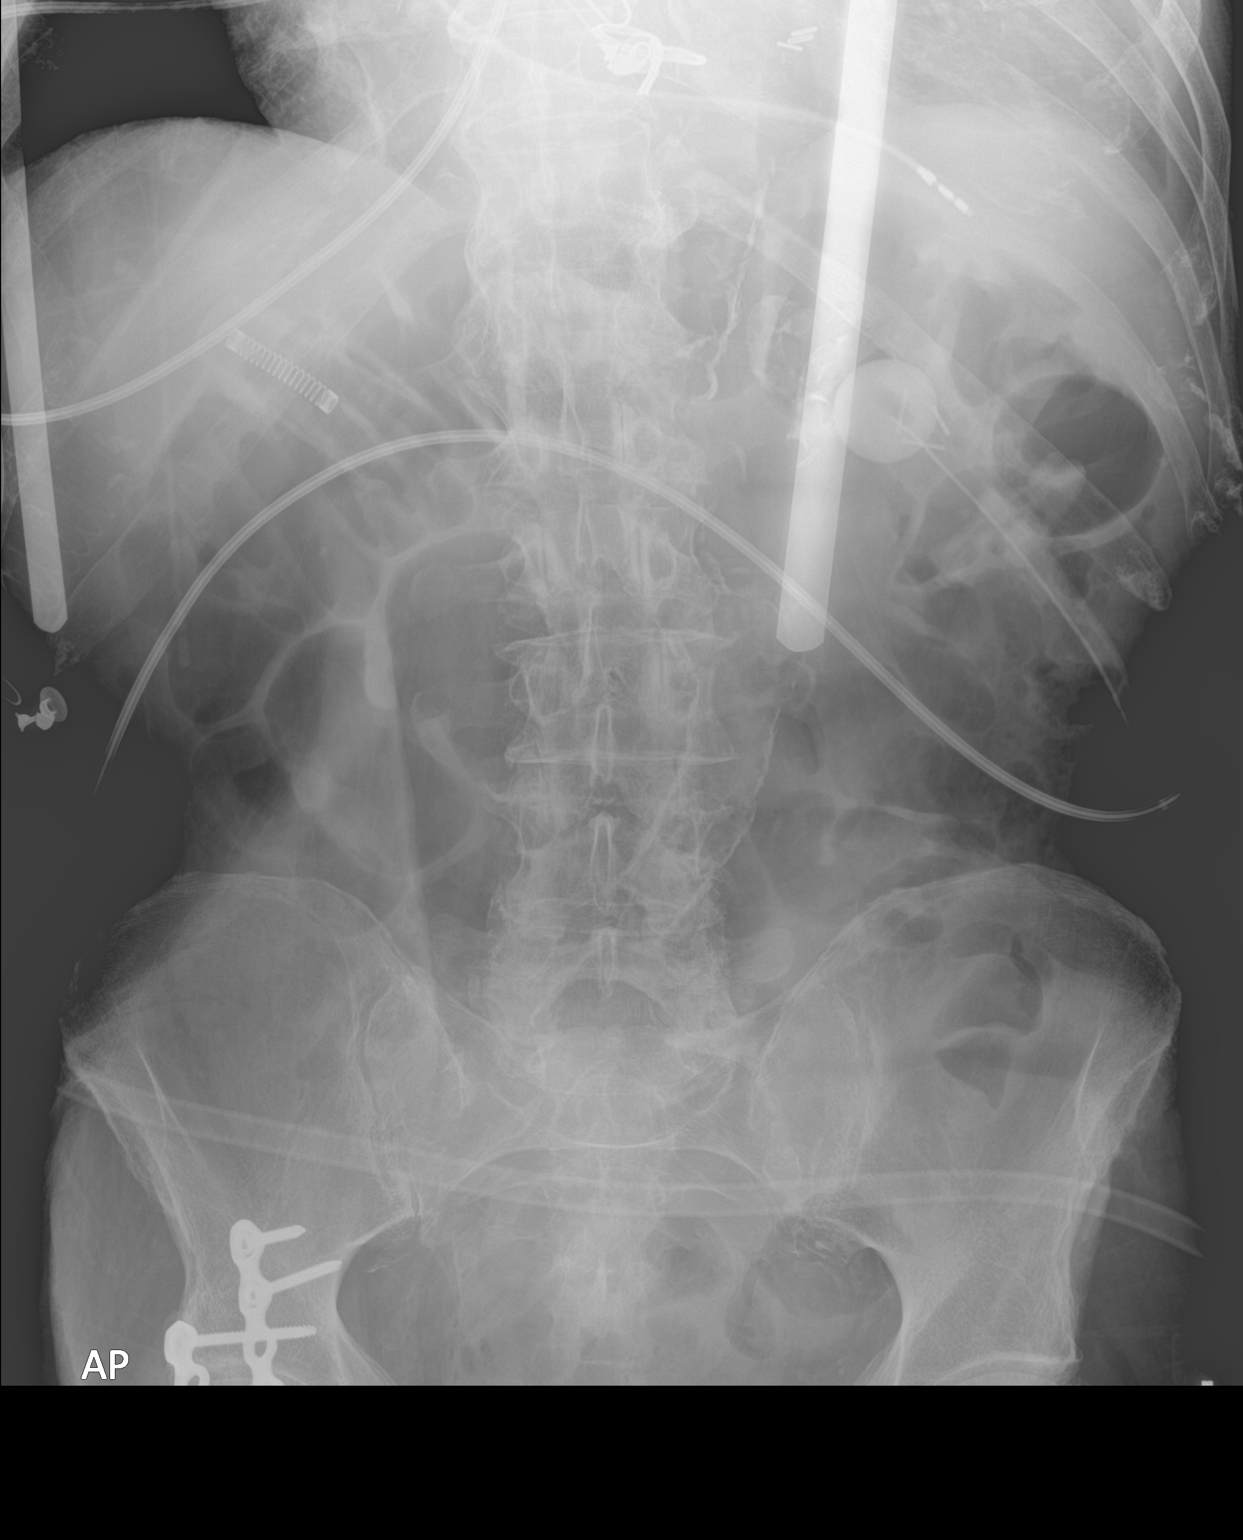

[1 of 1 positions shown; findings below may reference images not displayed]

FINDINGS: Scattered large and small bowel gas is noted. No evidence of
constipation or fecal impaction is seen. Gastrostomy catheter is
noted in the left upper quadrant. Postsurgical changes in the pelvis
are noted. Degenerative change of the lumbar spine is seen.
IMPRESSION: No changes to suggest bowel impaction.

## 2020-10-21 IMAGING — DX DG CHEST 1V PORT
1 series · 1 of 1 positions shown · non-contrast
Comparison: Five days ago

CLINICAL DATA: Respiratory failure and tracheostomy

EXAM:
PORTABLE CHEST 1 VIEW

[chest ap]
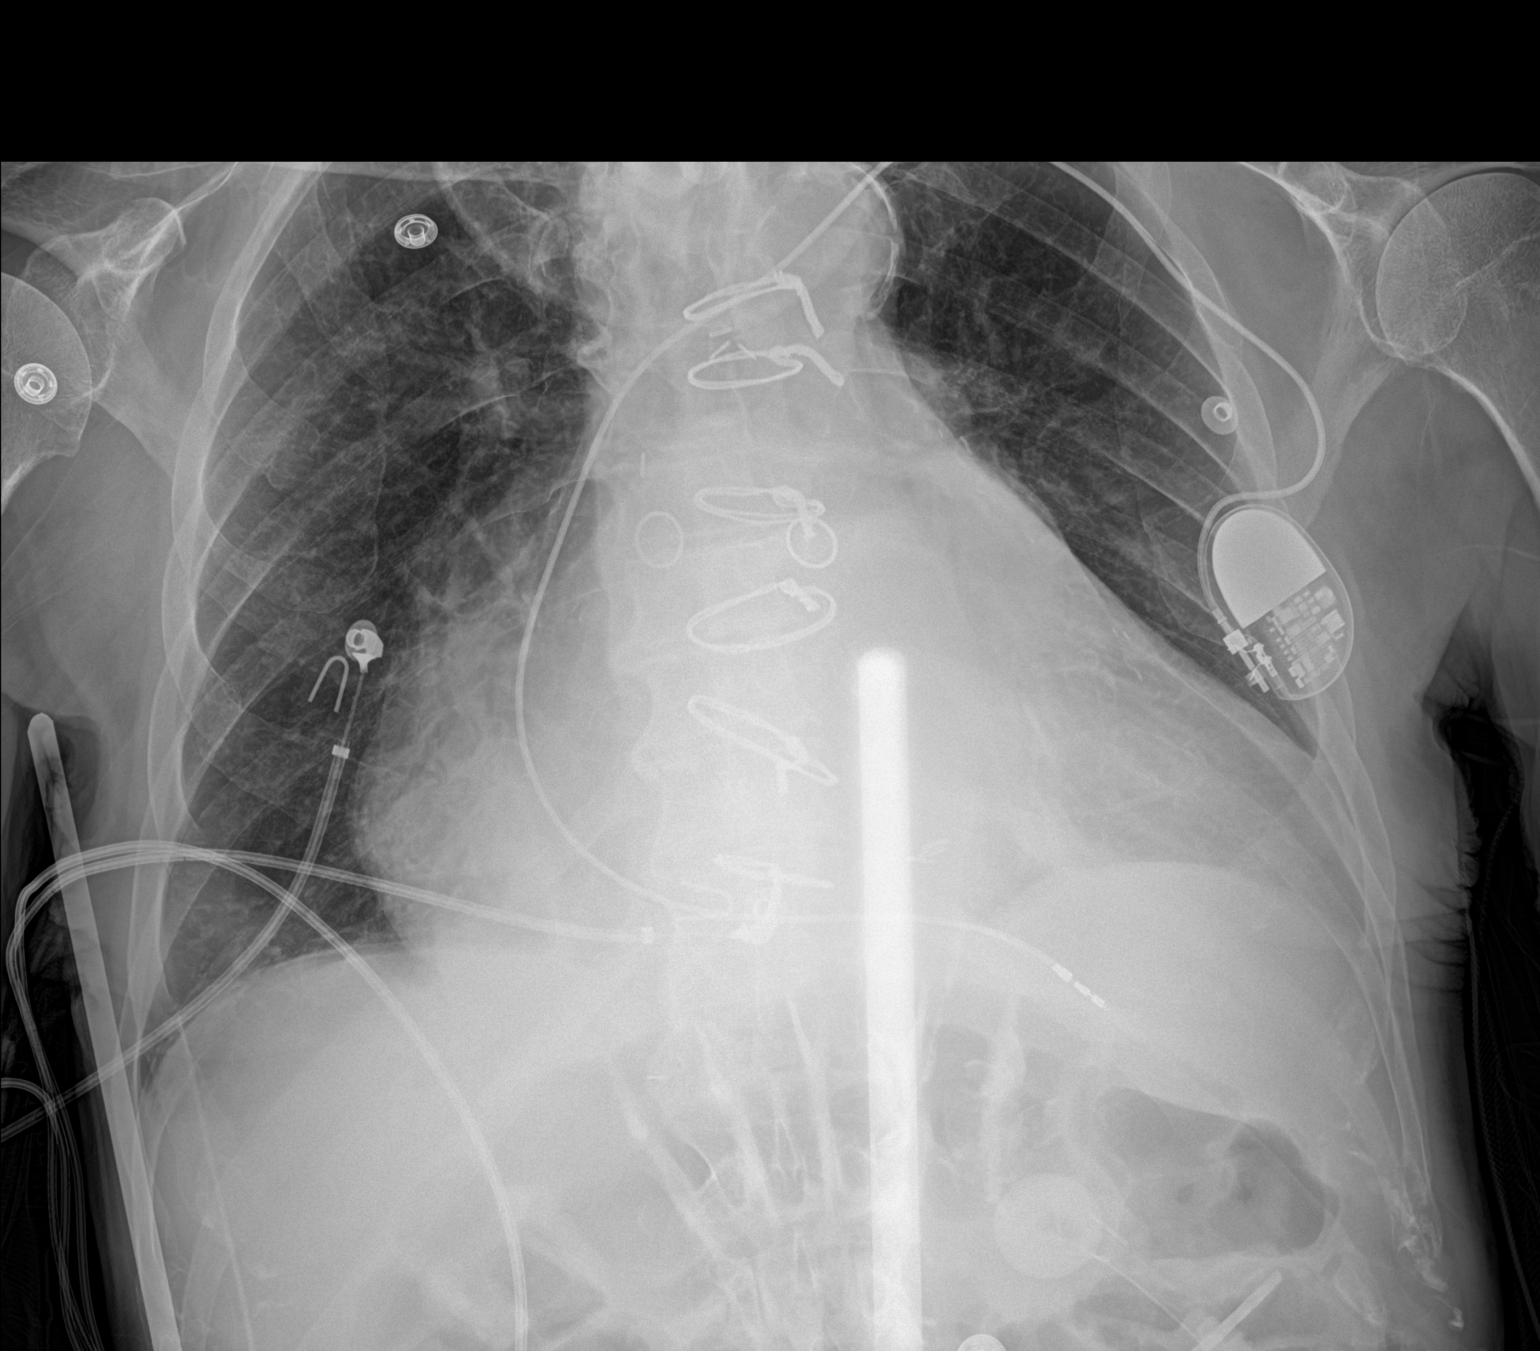

[1 of 1 positions shown; findings below may reference images not displayed]

FINDINGS: Single chamber pacer from the left into the right ventricle. Prior
CABG. Cardiomegaly is marked and chronic. Tracheostomy tube in
place. Percutaneous gastrostomy tube.

Mild retrocardiac atelectasis.  No edema, effusion, or pneumothorax.
IMPRESSION: Stable chest.  No evidence of acute disease.
